# Patient Record
Sex: Male | Born: 1970
Health system: Southern US, Community
[De-identification: ages and names within clinical notes are randomized; demographics above are authoritative.]

## PROBLEM LIST (undated history)

## (undated) DIAGNOSIS — R519 Headache, unspecified: Secondary | ICD-10-CM

## (undated) DIAGNOSIS — G8929 Other chronic pain: Secondary | ICD-10-CM

## (undated) DIAGNOSIS — R011 Cardiac murmur, unspecified: Secondary | ICD-10-CM

## (undated) DIAGNOSIS — K219 Gastro-esophageal reflux disease without esophagitis: Secondary | ICD-10-CM

## (undated) DIAGNOSIS — I10 Essential (primary) hypertension: Secondary | ICD-10-CM

## (undated) DIAGNOSIS — R51 Headache: Secondary | ICD-10-CM

## (undated) HISTORY — DX: Essential (primary) hypertension: I10

## (undated) HISTORY — DX: Gastro-esophageal reflux disease without esophagitis: K21.9

## (undated) HISTORY — DX: Other chronic pain: G89.29

## (undated) HISTORY — DX: Headache, unspecified: R51.9

## (undated) HISTORY — DX: Headache: R51

## (undated) HISTORY — DX: Cardiac murmur, unspecified: R01.1

---

## 2005-06-30 HISTORY — PX: OTHER SURGICAL HISTORY: SHX169

## 2007-02-04 ENCOUNTER — Ambulatory Visit (HOSPITAL_BASED_OUTPATIENT_CLINIC_OR_DEPARTMENT_OTHER): Admission: RE | Admit: 2007-02-04 | Discharge: 2007-02-04 | Payer: Self-pay | Admitting: Orthopedic Surgery

## 2009-03-14 ENCOUNTER — Emergency Department (HOSPITAL_COMMUNITY): Admission: EM | Admit: 2009-03-14 | Discharge: 2009-03-14 | Payer: Self-pay | Admitting: Emergency Medicine

## 2009-06-01 ENCOUNTER — Encounter: Admission: RE | Admit: 2009-06-01 | Discharge: 2009-06-01 | Payer: Self-pay | Admitting: Sports Medicine

## 2009-06-28 ENCOUNTER — Encounter: Admission: RE | Admit: 2009-06-28 | Discharge: 2009-06-28 | Payer: Self-pay | Admitting: Sports Medicine

## 2009-06-30 HISTORY — PX: MICRODISCECTOMY LUMBAR: SUR864

## 2010-10-04 LAB — DIFFERENTIAL
Lymphocytes Relative: 16 % (ref 12–46)
Lymphs Abs: 1.4 10*3/uL (ref 0.7–4.0)
Monocytes Absolute: 0.5 10*3/uL (ref 0.1–1.0)
Monocytes Relative: 6 % (ref 3–12)
Neutro Abs: 6.8 10*3/uL (ref 1.7–7.7)

## 2010-10-04 LAB — CSF CELL COUNT WITH DIFFERENTIAL
RBC Count, CSF: 0 /mm3
RBC Count, CSF: 6 /mm3 — ABNORMAL HIGH
Tube #: 1
Tube #: 4

## 2010-10-04 LAB — BASIC METABOLIC PANEL
Chloride: 102 mEq/L (ref 96–112)
GFR calc non Af Amer: >60 mL/min (ref >60)
Glucose, Bld: 96 mg/dL (ref 70–99)
Potassium: 4 mEq/L (ref 3.5–5.1)
Sodium: 138 mEq/L (ref 135–145)

## 2010-10-04 LAB — CBC
Hemoglobin: 14.6 g/dL (ref 13.0–17.0)
RBC: 5.16 MIL/uL (ref 4.22–5.81)

## 2010-10-04 LAB — GRAM STAIN

## 2010-11-12 NOTE — Op Note (Signed)
Ray Herrera, Ray Herrera NO.:  [id number]   MEDICAL RECORD NO.:  [id number]          PATIENT TYPE:  AMB   LOCATION:  DSC                          FACILITY:  [facility]   PHYSICIAN:  Loreta Ave, M.D. DATE OF BIRTH:  07-07-1970   DATE OF PROCEDURE:  02/04/2007  DATE OF DISCHARGE:                               OPERATIVE REPORT   PREOPERATIVE DIAGNOSIS:  Right shoulder impingement distal clavicle  osteolysis.   POSTOPERATIVE DIAGNOSIS:  Right shoulder impingement distal clavicle  osteolysis with partial tearing supraspinatus tendon above and below.  Also complex anterior labral tear.   PROCEDURE:  Right shoulder exam under anesthesia, arthroscopy,  debridement of labrum and rotator cuff.  Acromioplasty CA ligament  release.  Excision distal clavicle.   SURGEON:  Loreta Ave, MD.   ASSISTANT:  Zonia Kief, PA.   ANESTHESIA:  General.   BLOOD LOSS:  Minimal.   SPECIMENS:  None.   CULTURES:  None.   COMPLICATIONS:  None.   DRESSING:  Soft compressive sling.   PROCEDURE:  The patient brought to the operating room, placed on  operating table in supine position.  After adequate anesthesia had been  obtained, right shoulder examined.  Full motion stable shoulder.  Placed  in a beach-chair position on a shoulder positioner, prepped and draped  in usual sterile fashion.  Three portals, anterior, posterior and  lateral.  Shoulder entered with blunt obturator, distended.  Arthroscope  introduced, shoulder inspected.  Complex anterior labral tearing  debrided.  Biceps tendon and biceps anchor intact.  The entire biceps  tendon was invested up into the bottom of the supraspinatus tendon but  was still intact.  Articular cartilage looked good.  After debriding the  labrum from the 12 to 3 o'clock position where it was torn, the  remaining shoulder inspected.  Partial tearing undersurface rotator cuff  debrided.  No full-thickness tears.  Capsule ligamentous  structures  intact.  Cannula redirected subacromially.  Type III acromion.  Obvious  impingement from there as well as from the distal clavicle.  Cuff  debrided.  Bursa resected.  Acromioplasty to a type I acromion releasing  the CA ligament with cautery.  Distal clavicle exposed.  Periarticular  spurs and a lateral centimeter of clavicle resected.  Adequacy of  decompression  clavicle incision confirmed viewing from all portals.  Instruments and  fluid removed.  Portals closed with nylon.  Sterile compressive dressing  applied.  Sling applied.  Anesthesia reversed.  Brought to the recovery  room.  Tolerated surgery well.  No complications.      Loreta Ave, M.D.  Electronically Signed     DFM/MEDQ  D:  02/04/2007  T:  02/04/2007  Job:  [id number]

## 2011-04-14 IMAGING — RF DG FLUORO GUIDE NDL PLC/BX
1 series · 1 of 1 positions shown · non-contrast
Comparison: None

CLINICAL DATA: Headache

FLUORO GUIDED NEEDLE PLACEMENT

[Series 1: run · 1 of 1 slices shown]
[im 1/1]
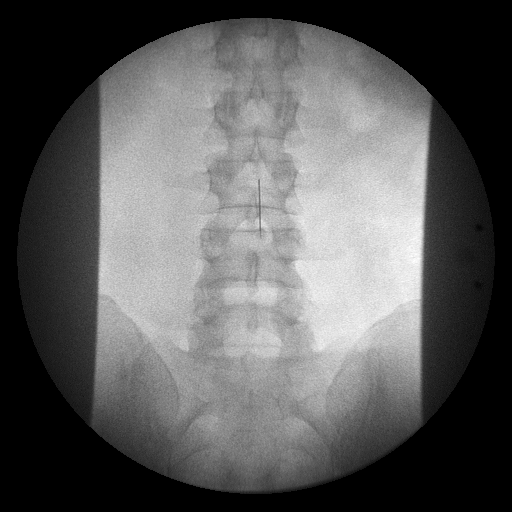

[1 of 1 positions shown; findings below may reference images not displayed]

FINDINGS: After preparation of the skin and local anesthesia, a 20
gauge spinal needle was advanced into the subarachnoid space at L2-
3, using a midline approach with fluoroscopic guidance.

Opening pressure is estimated at 26 cm water.  A total of
approximately 7 ml of clear CSF was collected into four vials and
sent for the laboratory studies requested.

Fluoro time 0.4 minutes.  The procedure well tolerated with no
immediate complications.
IMPRESSION: Fluoroscopic guided lumbar puncture yielding clear CSF with normal
opening pressure.

## 2011-08-06 ENCOUNTER — Encounter: Payer: Self-pay | Admitting: Physician Assistant

## 2011-08-06 ENCOUNTER — Telehealth: Payer: Self-pay | Admitting: Internal Medicine

## 2011-08-06 ENCOUNTER — Ambulatory Visit (INDEPENDENT_AMBULATORY_CARE_PROVIDER_SITE_OTHER): Payer: BC Managed Care – PPO | Admitting: Physician Assistant

## 2011-08-06 DIAGNOSIS — Z9889 Other specified postprocedural states: Secondary | ICD-10-CM

## 2011-08-06 DIAGNOSIS — K625 Hemorrhage of anus and rectum: Secondary | ICD-10-CM

## 2011-08-06 DIAGNOSIS — K219 Gastro-esophageal reflux disease without esophagitis: Secondary | ICD-10-CM

## 2011-08-06 MED ORDER — HYDROCORTISONE ACETATE 25 MG RE SUPP: RECTAL | Status: DC

## 2011-08-06 NOTE — Telephone Encounter (Signed)
Spoke with Jae Dire at Dr. Sherene Sires  office and patient told her he has had blood on tissue paper when he wipes x several months. For the last 2 days, when he sits on the toilet he has "free bleeding". Patient scheduled to see Mike Gip, PA today at 3:30 PM.

## 2011-08-06 NOTE — Patient Instructions (Addendum)
We sent a prescription for suppositories to [facility].  We have also given you a work note for today through Friday. We have given you a Hemorrhoid brochure. You can make an appointment with Dr. Arlyce Dice or Mike Gip PA for 2 weeks out.

## 2011-08-06 NOTE — Progress Notes (Signed)
Subjective:    Patient ID: Ray Herrera, male    DOB: 1971-04-16, 41 y.o.   MRN: [id number]  HPI Ray Herrera is a pleasant generally healthy 41 year old white male new to GI today referred by Dr. Rosanne Ashing Herrera/orthopedics. Patient has history of  right rotator cuff repair and a prior lumbar laminectomy. He presents stating that he had noticed a small amount of bright red blood on the tissue with bowel movements over the past couple of months which did not alarm him as he was not having any other symptoms. However over the past couple of days he has been passing somewhat larger amounts of blood with blood dripping into the commode with bowel movements. Again this is bright red blood and does stop after a bowel movement. He has not seen any clots nor any melena. He has no complaint of abdominal pain says he has minimal rectal soreness. He has not had any abdominal cramping or problems with changes in bowel habits or constipation.  He has not had any previous GI evaluation. He also does have history of GERD and has a prescription for Nexium which he says he takes 3 or 4 times a month. He has had more persistent problems with reflux in the past but that has generally been associated with taking anti-inflammatories on a regular basis. He denies any dysphagia or odynophagia and his appetite has been fine. He has a long history of weight training over the past 20 years but has not been doing as much recently because he did injure his back again in January. Family history is negative for colon cancer polyps or inflammatory bowel disease.    Review of Systems  Constitutional: Negative.   HENT: Negative.   Eyes: Negative.   Respiratory: Negative.   Cardiovascular: Negative.   Gastrointestinal: Positive for anal bleeding.  Genitourinary: Negative.   Musculoskeletal: Negative.   Skin: Negative.   Neurological: Negative.   Hematological: Negative.   Psychiatric/Behavioral: Negative.    Outpatient Encounter  Prescriptions as of 08/06/2011  Medication Sig Dispense Refill  . Esomeprazole Magnesium (NEXIUM PO) Take by mouth as needed.      . hydrocortisone (ANUSOL-HC) 25 MG suppository Use suppository rectally twice daily for 5 days, AM and PM  and at bedtime for 10 days.  20 suppository  1      Allergies  Allergen Reactions  . Other Itching    [place] Nuts   Active Ambulatory Problems    Diagnosis Date Noted  . GERD (gastroesophageal reflux disease) 08/06/2011  . Hx of decompressive lumbar laminectomy 08/06/2011  . H/O repair of rotator cuff 08/06/2011  . Rectal bleeding 08/06/2011   Resolved Ambulatory Problems    Diagnosis Date Noted  . No Resolved Ambulatory Problems   No Additional Past Medical History    Objective:   Physical Exam well-developed white male in no acute distress, pleasant blood pressure 120/80 pulse 74 weight 224 height 6 foot 1. HEENT; not hematocrit normal cephalic EOMI PERRLA sclera anicteric, Next abnormal no JVD, Cardiovascular; regular rate and rhythm with S1-S2 no murmur gallop, Pulmonary; clear bilaterally, Abdomen;; nontender nondistended no palpable masses or hepatosplenomegaly no guarding no rebound, bowel sounds are present, Rectal no external lesion noted on internal exam he has a swollen internal hemorrhoid which is non-thrombosed,, nontender no blood on the examining glove but mucous is heme positive. Extremities; no clubbing cyanosis or edema, Psych; mood and affect normal and appropriate        Assessment & Plan:  #1  41 year old white male with a several month history of very small volume bright red blood per rectum and now with 2 day history of increased hematochezia. On exam he has an internal hemorrhoid which is swollen but not thrombosed and his bleeding is very likely secondary to the internal hemorrhoid. He has not had any previous GI evaluation therefore cannot completely rule out other occult colon lesions. #2 History of GERD, intermittent PPI use  currently  Plan; start Anusol-HC suppositories twice daily for 5 days and then at bedtime for 10 more days. Patient to be out of work over the next 2-3 days, home to rest. 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 Patient advised to call should he have increasing bleeding over the next several days. Discussed colonoscopy and also upper endoscopy to screen for Barrett's given fairly chronic history of GERD. We will see him back in 2 weeks to reassess response to the suppositories and then decide regarding colonoscopy and upper endoscopy.

## 2011-08-07 NOTE — Progress Notes (Signed)
Reviewed and agree with management. Khaalid Lefkowitz D. Jennifer Payes, M.D., FACG  

## 2013-03-17 ENCOUNTER — Encounter: Payer: Self-pay | Admitting: Family Medicine

## 2013-03-17 ENCOUNTER — Ambulatory Visit (INDEPENDENT_AMBULATORY_CARE_PROVIDER_SITE_OTHER): Payer: BC Managed Care – PPO | Admitting: Family Medicine

## 2013-03-17 VITALS — BP 142/100 | HR 68 | Temp 98.1°F | Ht 71.5 in | Wt 228.0 lb

## 2013-03-17 DIAGNOSIS — K219 Gastro-esophageal reflux disease without esophagitis: Secondary | ICD-10-CM

## 2013-03-17 DIAGNOSIS — K625 Hemorrhage of anus and rectum: Secondary | ICD-10-CM

## 2013-03-17 DIAGNOSIS — Z136 Encounter for screening for cardiovascular disorders: Secondary | ICD-10-CM

## 2013-03-17 DIAGNOSIS — Z79899 Other long term (current) drug therapy: Secondary | ICD-10-CM

## 2013-03-17 DIAGNOSIS — R194 Change in bowel habit: Secondary | ICD-10-CM

## 2013-03-17 DIAGNOSIS — D239 Other benign neoplasm of skin, unspecified: Secondary | ICD-10-CM

## 2013-03-17 DIAGNOSIS — I10 Essential (primary) hypertension: Secondary | ICD-10-CM

## 2013-03-17 DIAGNOSIS — D229 Melanocytic nevi, unspecified: Secondary | ICD-10-CM

## 2013-03-17 DIAGNOSIS — Z125 Encounter for screening for malignant neoplasm of prostate: Secondary | ICD-10-CM

## 2013-03-17 DIAGNOSIS — K921 Melena: Secondary | ICD-10-CM

## 2013-03-17 DIAGNOSIS — R198 Other specified symptoms and signs involving the digestive system and abdomen: Secondary | ICD-10-CM

## 2013-03-17 DIAGNOSIS — Z833 Family history of diabetes mellitus: Secondary | ICD-10-CM

## 2013-03-17 DIAGNOSIS — Z0000 Encounter for general adult medical examination without abnormal findings: Secondary | ICD-10-CM

## 2013-03-17 DIAGNOSIS — R030 Elevated blood-pressure reading, without diagnosis of hypertension: Secondary | ICD-10-CM | POA: Insufficient documentation

## 2013-03-17 LAB — COMPREHENSIVE METABOLIC PANEL
ALT: 48 U/L (ref 0–53)
CO2: 27 mEq/L (ref 19–32)
Creatinine, Ser: 1.3 mg/dL (ref 0.4–1.5)
GFR: 66.31 mL/min (ref >60.00)
Total Bilirubin: 1.1 mg/dL (ref 0.3–1.2)

## 2013-03-17 LAB — CBC WITH DIFFERENTIAL/PLATELET
Basophils Absolute: 0 10*3/uL (ref 0.0–0.1)
Eosinophils Absolute: 0.1 10*3/uL (ref 0.0–0.7)
HCT: 51.7 % (ref 39.0–52.0)
Lymphs Abs: 2.3 10*3/uL (ref 0.7–4.0)
MCHC: 33.3 g/dL (ref 30.0–36.0)
MCV: 81.9 fl (ref 78.0–100.0)
Monocytes Absolute: 0.6 10*3/uL (ref 0.1–1.0)
Platelets: 239 10*3/uL (ref 150.0–400.0)
RDW: 16.7 % — ABNORMAL HIGH (ref 11.5–14.6)

## 2013-03-17 LAB — LIPID PANEL
Cholesterol: 187 mg/dL (ref 0–200)
HDL: 41.9 mg/dL (ref >39.00)
Triglycerides: 36 mg/dL (ref 0.0–149.0)
VLDL: 7.2 mg/dL (ref 0.0–40.0)

## 2013-03-17 LAB — PSA: PSA: 0.97 ng/mL (ref 0.10–4.00)

## 2013-03-17 LAB — HEMOGLOBIN A1C: Hgb A1c MFr Bld: 5.5 % (ref 4.6–6.5)

## 2013-03-17 NOTE — Progress Notes (Signed)
Subjective:    Patient ID: Ray Herrera, male    DOB: 1970/09/29, 42 y.o.   MRN: [id number]  HPI  42 yo male here to establish care and for CPX.  Changes in bowel habits- past several months, increased episodes of diarrhea with mucous.  Does also have blood in his stool 3-4 times per month.  Was seen by Dr. Juanda Chance last year for rectal bleeding and was diagnosed with probable internal hemorrhoid.  No colonoscopy or endoscopy done but it was mentioned as part of plan if symptoms continued. Does have family h/o IBD- old brother with Crohn's disease.  No family h/o colon CA. Also has associated GERD symptoms- he takes nexium (not daily).  Also drinks ETOH on weekends which worsens these symptoms. No weight loss.  Does endorse night sweats but he attributes this to anabolic steroid use. Denies gynecomastia or changes in testicular size or sexual function.  No family h/o prostate CA.  Elevated BP- BP elevated today.  Does not check outside of doctor's offices.  Does have FH of stroke and HTN. He does endorse occasional HA.  Patient Active Problem List   Diagnosis Date Noted  . Routine general medical examination at a health care facility 03/17/2013  . Screening for ischemic heart disease 03/17/2013  . H/O long-term treatment with high-risk medication 03/17/2013  . Elevated blood-pressure reading without diagnosis of hypertension   . GERD (gastroesophageal reflux disease) 08/06/2011  . Hx of decompressive lumbar laminectomy 08/06/2011  . H/O repair of rotator cuff 08/06/2011  . Rectal bleeding 08/06/2011   Past Medical History  Diagnosis Date  . GERD (gastroesophageal reflux disease)   . Hypertension    Past Surgical History  Procedure Laterality Date  . Shoulder surgery      right  . Lumbar disc surgery  2011   History  Substance Use Topics  . Smoking status: Never Smoker   . Smokeless tobacco: Never Used  . Alcohol Use: Yes     Comment: 2 shots weekly   Family History   Problem Relation Age of Onset  . Colon cancer Neg Hx   . Diabetes Father   . Heart disease Father   . Hypertension Father   . Diabetes Paternal Grandfather   . Heart disease Paternal Grandfather   . Lung cancer Maternal Grandfather   . Cancer      abdominal, maternal great aunt  . Crohn's disease Brother    Allergies  Allergen Reactions  . Other Itching    [place] Nuts   Current Outpatient Prescriptions on File Prior to Visit  Medication Sig Dispense Refill  . Esomeprazole Magnesium (NEXIUM PO) Take by mouth as needed.       No current facility-administered medications on file prior to visit.   The PMH, PSH, Social History, Family History, Medications, and allergies have been reviewed in [facility], and have been updated if relevant.   Review of Systems See HPI Patient reports no  vision/ hearing changes,anorexia, weight change, fever ,adenopathy, persistant / recurrent hoarseness, swallowing issues, chest pain, edema,persistant / recurrent cough, hemoptysis, dyspnea(rest, exertional, paroxysmal nocturnal), GU symptoms(dysuria, hematuria, pyuria, voiding/incontinence  Issues) syncope, focal weakness, severe memory loss, c depression, anxiety, abnormal bruising/bleeding, major joint swelling.       Objective:   Physical Exam BP 142/100  Pulse 68  Temp(Src) 98.1 F (36.7 C)  Ht 5' 11.5" (1.816 m)  Wt 228 lb (103.42 kg)  BMI 31.36 kg/m2 General:  Athletic appearing male in NAD Eyes:  PERRL  Ears:  External ear exam shows no significant lesions or deformities.  Otoscopic examination reveals clear canals, tympanic membranes are intact bilaterally without bulging, retraction, inflammation or discharge. Hearing is grossly normal bilaterally. Nose:  External nasal examination shows no deformity or inflammation. Nasal mucosa are pink and moist without lesions or exudates. Mouth:  Oral mucosa and oropharynx without lesions or exudates.  Teeth in good repair. Neck:  no carotid bruit or  thyromegaly no cervical or supraclavicular lymphadenopathy  Chest:  No gynecomastia Lungs:  Normal respiratory effort, chest expands symmetrically. Lungs are clear to auscultation, no crackles or wheezes. Heart:  Normal rate and regular rhythm. S1 and S2 normal without gallop, murmur, click, rub or other extra sounds. Pulses:  R and L posterior tibial pulses are full and equal bilaterally  Extremities:  no edema  Skin:  Several atypical nevi on back- pt unsure if they have changed.     Assessment & Plan:  1. Bowel habit changes Now with blood and mucous in stool. Discussed with pt- I do feel he needs to proceed with colonoscopy to rule out IBD, diverticulosis, hemorrhoids, malignancy, etc. The patient indicates understanding of these issues and agrees with the plan. Will refer back to GI. - Ambulatory referral to Gastroenterology  2. Bloody stool Deteriorated. See #1. - Ambulatory referral to Gastroenterology  3. Routine general medical examination at a health care facility Reviewed preventive care protocols, scheduled due services, and updated immunizations Discussed nutrition, exercise, diet, and healthy lifestyle.  - Comprehensive metabolic panel  4. Screening for ischemic heart disease  - Lipid Panel  5. GERD (gastroesophageal reflux disease) Persistent.  Continue Nexium.  Discussed that ETOH will worsen this.  Pt aware. He would likely benefit from endoscopy for further evaluation and to rule out Barrett's.   6. Elevated blood pressure with diagnosis of hypertension Normotensive at GI appt last year and was nervous about meeting new doctor. Asymptomatic.  Will have him come back to see me in 1 month to recheck BP. No rx today.   7. Screening for prostate cancer Will check PSA given anabolic steroid use. - PSA  8. Family history of diabetes mellitus (DM) * - Hemoglobin A1c  10. H/O long-term treatment with high-risk medication See #7. - CBC with  Differential - Testosterone, free, total  11. Atypical nevi Refer to derm. - Ambulatory referral to Dermatology

## 2013-03-17 NOTE — Patient Instructions (Addendum)
Good to see you. We will call you with your lab results and colonoscopy appointment.  Let's keep an eye on your blood pressure.  Please come back to see me next month so we can recheck this.

## 2013-03-18 ENCOUNTER — Encounter: Payer: Self-pay | Admitting: Gastroenterology

## 2013-03-18 LAB — TESTOSTERONE, FREE, TOTAL, SHBG: Testosterone: 1005 ng/dL — ABNORMAL HIGH (ref 300–890)

## 2013-03-22 ENCOUNTER — Ambulatory Visit: Payer: BC Managed Care – PPO | Admitting: Family Medicine

## 2013-04-01 ENCOUNTER — Ambulatory Visit: Payer: BC Managed Care – PPO | Admitting: Family Medicine

## 2013-04-04 ENCOUNTER — Encounter: Payer: Self-pay | Admitting: Family Medicine

## 2013-04-04 ENCOUNTER — Ambulatory Visit (INDEPENDENT_AMBULATORY_CARE_PROVIDER_SITE_OTHER): Payer: BC Managed Care – PPO | Admitting: Family Medicine

## 2013-04-04 VITALS — BP 130/94 | HR 76 | Temp 98.4°F | Wt 236.0 lb

## 2013-04-04 DIAGNOSIS — L0291 Cutaneous abscess, unspecified: Secondary | ICD-10-CM

## 2013-04-04 DIAGNOSIS — L0390 Cellulitis, unspecified: Secondary | ICD-10-CM

## 2013-04-04 MED ORDER — DOXYCYCLINE HYCLATE 100 MG PO TABS: 100.0000 mg | ORAL_TABLET | Freq: Two times a day (BID) | ORAL | Status: DC

## 2013-04-04 NOTE — Progress Notes (Signed)
Thought he had an insect bite, he didn't see the insect.  [place] skin changes.  First noted about 4 days ago. No FCNAVD.  Areas is tender and hot.    Meds, vitals, and allergies reviewed.   ROS: See HPI.  Otherwise, noncontributory.  Nad L foot and ankle with normal inspection except for 8cm red warm area on distal medial shin w/o fluctuant mass

## 2013-04-04 NOTE — Patient Instructions (Addendum)
Start the doxycycline today.  Careful about sun exposure.    Elevate your leg as much as possible.  If not improving then let us know.  Take care.

## 2013-04-05 DIAGNOSIS — L0390 Cellulitis, unspecified: Secondary | ICD-10-CM | POA: Insufficient documentation

## 2013-04-05 NOTE — Assessment & Plan Note (Signed)
Okay for outpatient f/u.  Start doxy for cellulitis with routine cautions re: med and f/u.  He agrees.  Notify PCP as Lorain Childes.

## 2013-04-11 ENCOUNTER — Telehealth: Payer: Self-pay

## 2013-04-11 MED ORDER — DOXYCYCLINE HYCLATE 100 MG PO TABS: 100.0000 mg | ORAL_TABLET | Freq: Two times a day (BID) | ORAL | Status: DC

## 2013-04-11 NOTE — Telephone Encounter (Signed)
rx sent to pharmacy by e-script  

## 2013-04-11 NOTE — Telephone Encounter (Signed)
pts wife left v/m; pt was out of town this past weekend and lost doxycycline given to pt on 04/04/13; pt needs # 10 sent to [facility].Please advise.

## 2013-04-11 NOTE — Telephone Encounter (Signed)
Please call in #10 pills.  Thanks.  Same sig as prev.

## 2013-04-18 ENCOUNTER — Ambulatory Visit: Payer: BC Managed Care – PPO | Admitting: Family Medicine

## 2013-04-19 ENCOUNTER — Encounter: Payer: Self-pay | Admitting: Gastroenterology

## 2013-04-19 ENCOUNTER — Ambulatory Visit (INDEPENDENT_AMBULATORY_CARE_PROVIDER_SITE_OTHER): Payer: BC Managed Care – PPO | Admitting: Gastroenterology

## 2013-04-19 VITALS — BP 136/90 | HR 76 | Ht 71.65 in | Wt 234.0 lb

## 2013-04-19 DIAGNOSIS — K625 Hemorrhage of anus and rectum: Secondary | ICD-10-CM

## 2013-04-19 MED ORDER — NA SULFATE-K SULFATE-MG SULF 17.5-3.13-1.6 GM/177ML PO SOLN: 1.0000 | Freq: Once | ORAL | Status: DC

## 2013-04-19 NOTE — Patient Instructions (Signed)

## 2013-04-19 NOTE — Progress Notes (Signed)
History of Present Illness:  The patient has returned for reevaluation of rectal bleeding.  With almost every bowel movement he has bright red blood present consisting of blood on the tissue and mixed with his stools and in the water.  He denies rectal pain.  Over the past year stools have become somewhat erratic.  At times they're formed but at other times less well formed.  He denies abdominal pain.  He continues to have pyrosis but this is very intermittent.  He takes PPI therapy intermittently as well.  Family history is pertinent for Crohn's disease in his brother.    Review of Systems: Pertinent positive and negative review of systems were noted in the above HPI section. All other review of systems were otherwise negative.    Current Medications, Allergies, Past Medical History, Past Surgical History, Family History and Social History were reviewed in [employer] electronic medical record  Vital signs were reviewed in today's medical record. Physical Exam: General: Well developed , well nourished, no acute distress Skin: anicteric Head: Normocephalic and atraumatic Eyes:  sclerae anicteric, EOMI Ears: Normal auditory acuity Mouth: No deformity or lesions Lungs: Clear throughout to auscultation Heart: Regular rate and rhythm; no murmurs, rubs or bruits Abdomen: Soft, non tender and non distended. No masses, hepatosplenomegaly or hernias noted. Normal Bowel sounds Rectal: There are small external skin tags Musculoskeletal: Symmetrical with no gross deformities  Pulses:  Normal pulses noted Extremities: No clubbing, cyanosis, edema or deformities noted Neurological: Alert oriented x 4, grossly nonfocal Psychological:  Alert and cooperative. Normal mood and affect

## 2013-04-19 NOTE — Assessment & Plan Note (Signed)
Long history of intermittent rectal bleeding which is becoming more constant.  I suspect this is hemorrhoidal bleeding although a more proximal colonic bleeding source should be ruled out.  Recommendations #1 colonoscopy #2 it is determined that he is bleeding from hemorrhoids I will schedule him for band ligation

## 2013-04-19 NOTE — Assessment & Plan Note (Signed)
This is an intermittent problem for which he takes when necessary PPI therapy.  He will continue with the same

## 2013-04-20 ENCOUNTER — Encounter: Payer: Self-pay | Admitting: Gastroenterology

## 2013-05-20 ENCOUNTER — Encounter: Payer: Self-pay | Admitting: Gastroenterology

## 2013-05-20 ENCOUNTER — Ambulatory Visit (AMBULATORY_SURGERY_CENTER): Payer: BC Managed Care – PPO | Admitting: Gastroenterology

## 2013-05-20 VITALS — BP 132/82 | HR 63 | Temp 98.5°F | Resp 18 | Ht 71.0 in | Wt 234.0 lb

## 2013-05-20 DIAGNOSIS — D126 Benign neoplasm of colon, unspecified: Secondary | ICD-10-CM

## 2013-05-20 DIAGNOSIS — K625 Hemorrhage of anus and rectum: Secondary | ICD-10-CM

## 2013-05-20 MED ORDER — SODIUM CHLORIDE 0.9 % IV SOLN: 500.0000 mL | INTRAVENOUS | Status: DC

## 2013-05-20 NOTE — Op Note (Addendum)
[facility] [address] [place], [address]   COLONOSCOPY PROCEDURE REPORT  PATIENT: Ray Herrera, Ray Herrera  MR#: [id number] BIRTHDATE: [birth year] , 42  yrs. old GENDER: Male ENDOSCOPIST: Louis Meckel, MD REFERRED XB:JYNWG Aron, M.D. PROCEDURE DATE:  05/20/2013 PROCEDURE:   Colonoscopy with cold biopsy polypectomy First Screening Colonoscopy - Avg.  risk and is 50 yrs.  old or older Yes.  Prior Negative Screening - Now for repeat screening. N/A  History of Adenoma - Now for follow-up colonoscopy & has been > or = to 3 yrs.  N/A  Polyps Removed Today? Yes. ASA CLASS:   Class II INDICATIONS:Rectal Bleeding. MEDICATIONS: MAC sedation, administered by CRNA and propofol (Diprivan) 350mg  IV  DESCRIPTION OF PROCEDURE:   After the risks benefits and alternatives of the procedure were thoroughly explained, informed consent was obtained.  A digital rectal exam revealed no abnormalities of the rectum.   The LB [id number]  endoscope was introduced through the anus and advanced to the cecum, which was identified by both the appendix and ileocecal valve. No adverse events experienced.   The quality of the prep was excellent using Suprep  The instrument was then slowly withdrawn as the colon was fully examined.      COLON FINDINGS: A sessile 2mm polyp was found in the ascending colon.  A polypectomy was performed with cold forceps.   Internal hemorrhoids were found.   The colon was otherwise normal.  There was no diverticulosis, inflammation, polyps or cancers unless previously stated.  Retroflexed views revealed no abnormalities. The time to cecum=1 minutes 52 seconds.  Withdrawal time=12 minutes 09 seconds.  The scope was withdrawn and the procedure completed. COMPLICATIONS: There were no complications.  ENDOSCOPIC IMPRESSION: 1.   Sessile polyp was found in the ascending colon; polypectomy was performed with cold forceps 2.   Internal hemorrhoids 3.   The  colon was otherwise normal  RECOMMENDATIONS: band ligation of hemorrhoids for rectal bleeding  eSigned:  Louis Meckel, MD 05/25/2013 9:00 AM Revised: 05/25/2013 9:00 AM  cc:   PATIENT NAME:  Ray Herrera, Ray Herrera MR#: [id number]

## 2013-05-20 NOTE — Progress Notes (Signed)
Called to room to assist during endoscopic procedure.  Patient ID and intended procedure confirmed with present staff. Received instructions for my participation in the procedure from the performing physician. ewm 

## 2013-05-20 NOTE — Patient Instructions (Signed)
Polyp information sheet given.  Hemorrhoid information sheet and pamplet given.  YOU HAD AN ENDOSCOPIC PROCEDURE TODAY AT THE [facility]: Refer to the procedure report that was given to you for any specific questions about what was found during the examination.  If the procedure report does not answer your questions, please call your gastroenterologist to clarify.  If you requested that your care partner not be given the details of your procedure findings, then the procedure report has been included in a sealed envelope for you to review at your convenience later.  YOU SHOULD EXPECT: Some feelings of bloating in the abdomen. Passage of more gas than usual.  Walking can help get rid of the air that was put into your GI tract during the procedure and reduce the bloating. If you had a lower endoscopy (such as a colonoscopy or flexible sigmoidoscopy) you may notice spotting of blood in your stool or on the toilet paper. If you underwent a bowel prep for your procedure, then you may not have a normal bowel movement for a few days.  DIET: Your first meal following the procedure should be a light meal and then it is ok to progress to your normal diet.  A half-sandwich or bowl of soup is an example of a good first meal.  Heavy or fried foods are harder to digest and may make you feel nauseous or bloated.  Likewise meals heavy in dairy and vegetables can cause extra gas to form and this can also increase the bloating.  Drink plenty of fluids but you should avoid alcoholic beverages for 24 hours.  ACTIVITY: Your care partner should take you home directly after the procedure.  You should plan to take it easy, moving slowly for the rest of the day.  You can resume normal activity the day after the procedure however you should NOT DRIVE or use heavy machinery for 24 hours (because of the sedation medicines used during the test).    SYMPTOMS TO REPORT IMMEDIATELY: A gastroenterologist can be reached at  any hour.  During normal business hours, 8:30 AM to 5:00 PM Monday through Friday, call [phone].  After hours and on weekends, please call the GI answering service at [phone] who will take a message and have the physician on call contact you.   Following lower endoscopy (colonoscopy or flexible sigmoidoscopy):  Excessive amounts of blood in the stool  Significant tenderness or worsening of abdominal pains  Swelling of the abdomen that is new, acute  Fever of 100F or higher  FOLLOW UP: If any biopsies were taken you will be contacted by phone or by letter within the next 1-3 weeks.  Call your gastroenterologist if you have not heard about the biopsies in 3 weeks.  Our staff will call the home number listed on your records the next business day following your procedure to check on you and address any questions or concerns that you may have at that time regarding the information given to you following your procedure. This is a courtesy call and so if there is no answer at the home number and we have not heard from you through the emergency physician on call, we will assume that you have returned to your regular daily activities without incident.  SIGNATURES/CONFIDENTIALITY: You and/or your care partner have signed paperwork which will be entered into your electronic medical record.  These signatures attest to the fact that that the information above on your After Visit Summary has been  reviewed and is understood.  Full responsibility of the confidentiality of this discharge information lies with you and/or your care-partner. 

## 2013-05-20 NOTE — Progress Notes (Signed)
Patient did not have preoperative order for IV antibiotic SSI prophylaxis. (G8918)  Patient did not experience any of the following events: a burn prior to discharge; a fall within the facility; wrong site/side/patient/procedure/implant event; or a hospital transfer or hospital admission upon discharge from the facility. (G8907)  

## 2013-05-20 NOTE — Progress Notes (Signed)
  [facility] Anesthesia Post-op Note  Patient: Ray Herrera  Procedure(s) Performed: colonoscopy  Patient Location: [facility] - Recovery Area  Anesthesia Type: Deep Sedation/Propofol  Level of Consciousness: awake, oriented and patient cooperative  Airway and Oxygen Therapy: Patient Spontanous Breathing  Post-op Pain: none  Post-op Assessment:  Post-op Vital signs reviewed, Patient's Cardiovascular Status Stable, Respiratory Function Stable, Patent Airway, No signs of Nausea or vomiting and Pain level controlled  Post-op Vital Signs: Reviewed and stable  Complications: No apparent anesthesia complications  Tempest Frankland E 3:59 PM

## 2013-05-23 ENCOUNTER — Telehealth: Payer: Self-pay | Admitting: *Deleted

## 2013-05-23 NOTE — Telephone Encounter (Signed)
Spoke with pt.  States  He is doing well, no pain, fever or problems.  Will get app. For hemorrhoid banding.  Advised that he will hear from his pathology in one Week to 10 days.

## 2013-05-25 ENCOUNTER — Encounter: Payer: Self-pay | Admitting: Gastroenterology

## 2013-06-01 ENCOUNTER — Telehealth: Payer: Self-pay | Admitting: *Deleted

## 2013-06-01 NOTE — Telephone Encounter (Signed)
Message copied by Marlowe Kays on Wed Jun 01, 2013  2:05 PM ------      Message from: Melvia Heaps D      Created: Fri May 20, 2013  3:58 PM       Please schedule for hemorrhoidal band ligation ------

## 2013-06-01 NOTE — Telephone Encounter (Signed)
Mailed letter with appt date and time on it

## 2013-06-03 ENCOUNTER — Other Ambulatory Visit: Payer: Self-pay | Admitting: Physician Assistant

## 2013-07-11 ENCOUNTER — Ambulatory Visit (INDEPENDENT_AMBULATORY_CARE_PROVIDER_SITE_OTHER): Payer: BC Managed Care – PPO | Admitting: Gastroenterology

## 2013-07-11 ENCOUNTER — Encounter: Payer: Self-pay | Admitting: Gastroenterology

## 2013-07-11 VITALS — BP 118/86 | HR 72 | Ht 71.0 in | Wt 234.0 lb

## 2013-07-11 DIAGNOSIS — K648 Other hemorrhoids: Secondary | ICD-10-CM

## 2013-07-11 NOTE — Progress Notes (Signed)
PROCEDURE NOTE: The patient presents with symptomatic grade *2**  hemorrhoids, requesting rubber band ligation of his/her hemorrhoidal disease.  All risks, benefits and alternative forms of therapy were described and informed consent was obtained.   The anorectum was pre-medicated with lubricant and nitroglycerine ointment The decision was made to band the *left lateral** internal hemorrhoid, and the [facility] O'Regan System was used to perform band ligation without complication.  Digital anorectal examination was then performed to assure proper positioning of the band, and to adjust the banded tissue as required.  The patient was discharged home without pain or other issues.  Dietary and behavioral recommendations were given and along with follow-up instructions.    The patient will return in *2** for  follow-up and possible additional banding as required. No complications were encountered and the patient tolerated the procedure well.   

## 2013-07-11 NOTE — Patient Instructions (Signed)
Your 2nd banding is scheduled on 08/22/2013 at 1:45pm  [place]   1. The procedure you have had should have been relatively painless since the banding of the area involved does not have nerve endings and there is no pain sensation.  The rubber band cuts off the blood supply to the hemorrhoid and the band may fall off as soon as 48 hours after the banding (the band may occasionally be seen in the toilet bowl following a bowel movement). You may notice a temporary feeling of fullness in the rectum which should respond adequately to plain Tylenol or Motrin.  2. Following the banding, avoid strenuous exercise that evening and resume full activity the next day.  A sitz bath (soaking in a warm tub) or bidet is soothing, and can be useful for cleansing the area after bowel movements.     3. To avoid constipation, take two tablespoons of natural wheat bran, natural oat bran, flax, Benefiber or any over the counter fiber supplement and increase your water intake to 7-8 glasses daily.    4. Unless you have been prescribed anorectal medication, do not put anything inside your rectum for two weeks: No suppositories, enemas, fingers, etc.  5. Occasionally, you may have more bleeding than usual after the banding procedure.  This is often from the untreated hemorrhoids rather than the treated one.  Don't be concerned if there is a tablespoon or so of blood.  If there is more blood than this, lie flat with your bottom higher than your head and apply an ice pack to the area. If the bleeding does not stop within a half an hour or if you feel faint, call our office at [phone] or go to the emergency room.  6. Problems are not common; however, if there is a substantial amount of bleeding, severe pain, chills, fever or difficulty passing urine (very rare) or other problems, you should call us at [phone] or report to the nearest emergency room.  7. Do not stay  seated continuously for more than 2-3 hours for a day or two after the procedure.  Tighten your buttock muscles 10-15 times every two hours and take 10-15 deep breaths every 1-2 hours.  Do not spend more than a few minutes on the toilet if you cannot empty your bowel; instead re-visit the toilet at a later time.

## 2013-08-18 ENCOUNTER — Encounter: Payer: Self-pay | Admitting: Internal Medicine

## 2013-08-18 ENCOUNTER — Other Ambulatory Visit: Payer: Self-pay

## 2013-08-18 ENCOUNTER — Ambulatory Visit (INDEPENDENT_AMBULATORY_CARE_PROVIDER_SITE_OTHER): Payer: BC Managed Care – PPO | Admitting: Internal Medicine

## 2013-08-18 ENCOUNTER — Telehealth: Payer: Self-pay

## 2013-08-18 VITALS — BP 132/90 | HR 83 | Temp 98.0°F | Wt 244.0 lb

## 2013-08-18 DIAGNOSIS — M5430 Sciatica, unspecified side: Secondary | ICD-10-CM

## 2013-08-18 MED ORDER — METHYLPREDNISOLONE ACETATE 80 MG/ML IJ SUSP
80.0000 mg | Freq: Once | INTRAMUSCULAR | Status: AC
  Administered 2013-08-18: 80 mg via INTRAMUSCULAR

## 2013-08-18 MED ORDER — TRAMADOL HCL 50 MG PO TABS: 50.0000 mg | ORAL_TABLET | Freq: Three times a day (TID) | ORAL | Status: DC | PRN

## 2013-08-18 NOTE — Patient Instructions (Addendum)

## 2013-08-18 NOTE — Addendum Note (Signed)
Addended by: Lurlean Nanny on: 08/18/2013 04:50 PM   Modules accepted: Orders

## 2013-08-18 NOTE — Progress Notes (Signed)
Pre visit review using our clinic review tool, if applicable. No additional management support is needed unless otherwise documented below in the visit note. 
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
## 2013-08-18 NOTE — Addendum Note (Signed)
Addended by: Jearld Fenton on: 08/18/2013 01:49 PM   Modules accepted: Orders

## 2013-08-18 NOTE — Telephone Encounter (Signed)
Tramadol per verbal order from [facility]

## 2013-08-22 ENCOUNTER — Telehealth: Payer: Self-pay | Admitting: Gastroenterology

## 2013-08-22 ENCOUNTER — Encounter: Payer: BC Managed Care – PPO | Admitting: Gastroenterology

## 2013-08-22 NOTE — Telephone Encounter (Signed)
No charge. 

## 2014-06-14 ENCOUNTER — Ambulatory Visit (INDEPENDENT_AMBULATORY_CARE_PROVIDER_SITE_OTHER): Payer: BC Managed Care – PPO | Admitting: Internal Medicine

## 2014-06-14 ENCOUNTER — Encounter: Payer: Self-pay | Admitting: Internal Medicine

## 2014-06-14 VITALS — BP 122/80 | HR 70 | Temp 97.5°F | Wt 242.0 lb

## 2014-06-14 DIAGNOSIS — K648 Other hemorrhoids: Secondary | ICD-10-CM

## 2014-06-14 DIAGNOSIS — M79642 Pain in left hand: Secondary | ICD-10-CM

## 2014-06-14 NOTE — Assessment & Plan Note (Signed)
Not arthritic Not sure if this is from his neck--but may well be CTS Discussed trying brace at night Will set up with neuro for definitive diagnosis

## 2014-06-14 NOTE — Progress Notes (Signed)
Pre visit review using our clinic review tool, if applicable. No additional management support is needed unless otherwise documented below in the visit note. 

## 2014-06-14 NOTE — Progress Notes (Signed)
   Subjective:    Patient ID: Ray Herrera, male    DOB: 08/26/1970, 43 y.o.   MRN: [id number]  HPI Here for rectal bleeding Wants referral to GI  He had banding once but didn't go back for the other procedures Ongoing bleeding Mostly just blood with stool---slight bleeding after No regular external lesions  Also having pain in his left hand-- probably started around 6-8 months ago Trouble making a fist May have pain even straightening his hands Had to change grip on weight bar--keeping left hand under No pain in forearm Intermittent numbness in hands---not sure which fingers  Also with pain in neck and shoulder Limited ROM and pain in gym Did have surgery on right shoulder by Dr Percell Miller Does heavy lifting in the gym---has had to decrease the weight he pushes  Current Outpatient Prescriptions on File Prior to Visit  Medication Sig Dispense Refill  . Esomeprazole Magnesium (NEXIUM PO) Take by mouth as needed.    . traMADol (ULTRAM) 50 MG tablet Take 1 tablet (50 mg total) by mouth every 8 (eight) hours as needed. 30 tablet 0   No current facility-administered medications on file prior to visit.    Allergies  Allergen Reactions  . Other Itching    [place] Nuts    Past Medical History  Diagnosis Date  . GERD (gastroesophageal reflux disease)   . Hypertension   . Chronic headaches     Past Surgical History  Procedure Laterality Date  . Distoclavicalectomy      right  . Microdiscectomy lumbar  2011    L5-S1    Family History  Problem Relation Age of Onset  . Colon cancer Neg Hx   . Diabetes Father   . Heart disease Father   . Hypertension Father   . Diabetes Paternal Grandfather   . Heart disease Paternal Grandfather   . Lung cancer Maternal Grandfather   . Cancer      abdominal, maternal great aunt  . Crohn's disease Brother   . Irritable bowel syndrome Father     History   Social History  . Marital Status: Married    Spouse Name: N/A    Number of  Children: 1  . Years of Education: N/A   Occupational History  .  Surveyor, mining   Social History Main Topics  . Smoking status: Never Smoker   . Smokeless tobacco: Never Used  . Alcohol Use: Yes     Comment: 2 shots weekly  . Drug Use: No  . Sexual Activity: Not on file   Other Topics Concern  . Not on file   Social History Narrative   Caffeine  Daily   Married to [employer]   Does use anabolic steroids- lifts weight   Works as Radiation protection practitioner               Review of Systems Has dropped some weight from less weight training Has never slept well--- hand may be worst then    Objective:   Physical Exam  Neck: Normal range of motion. Neck supple.  Musculoskeletal:  No joint swelling or tenderness in left hand or elbow Limited abduction and external rotation of shoulder--but not bad  Neurological:  Slight decreased left hand grip strength Pain with attempts to passively extend fingers on left---?more in 3rd and 4th          Assessment & Plan:

## 2014-06-14 NOTE — Assessment & Plan Note (Signed)
Will try to set up repeat banding with Dr Deatra Ina

## 2014-07-21 ENCOUNTER — Ambulatory Visit: Payer: BC Managed Care – PPO | Admitting: Neurology

## 2014-07-28 ENCOUNTER — Encounter: Payer: Self-pay | Admitting: Neurology

## 2014-07-28 ENCOUNTER — Ambulatory Visit (INDEPENDENT_AMBULATORY_CARE_PROVIDER_SITE_OTHER): Payer: BLUE CROSS/BLUE SHIELD | Admitting: Neurology

## 2014-07-28 VITALS — BP 124/80 | HR 62 | Ht 73.0 in | Wt 245.3 lb

## 2014-07-28 DIAGNOSIS — M6289 Other specified disorders of muscle: Secondary | ICD-10-CM

## 2014-07-28 DIAGNOSIS — R202 Paresthesia of skin: Secondary | ICD-10-CM

## 2014-07-28 DIAGNOSIS — R29898 Other symptoms and signs involving the musculoskeletal system: Secondary | ICD-10-CM

## 2014-07-28 LAB — RHEUMATOID FACTOR

## 2014-07-28 LAB — C-REACTIVE PROTEIN: CRP: <0.5 mg/dL (ref <0.60)
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
## 2014-07-28 NOTE — Patient Instructions (Addendum)
1.  MRI cervical spine wo contrast 2.  Check ESR, CRP, ANA, rheumatoid factor 3.  Telephone update with results, consider EMG going forward 

## 2014-07-29 LAB — SEDIMENTATION RATE: Sed Rate: 1 mm/hr (ref 0–16)

## 2014-07-31 ENCOUNTER — Telehealth: Payer: Self-pay | Admitting: Neurology

## 2014-07-31 LAB — ANA: Anti Nuclear Antibody(ANA): NEGATIVE

## 2014-07-31 NOTE — Telephone Encounter (Signed)
Left message on machine to make patient aware of MR scheduled at [facility] on 08/10/2014 at 5:50 pm. He is to call with any questions.

## 2014-08-10 ENCOUNTER — Ambulatory Visit
Admission: RE | Admit: 2014-08-10 | Discharge: 2014-08-10 | Disposition: A | Discharge status: Home / Self Care | Payer: BLUE CROSS/BLUE SHIELD | Source: Ambulatory Visit | Attending: Neurology | Admitting: Neurology

## 2014-08-10 DIAGNOSIS — R202 Paresthesia of skin: Secondary | ICD-10-CM

## 2014-08-10 DIAGNOSIS — R29898 Other symptoms and signs involving the musculoskeletal system: Secondary | ICD-10-CM

## 2014-08-11 ENCOUNTER — Ambulatory Visit (INDEPENDENT_AMBULATORY_CARE_PROVIDER_SITE_OTHER): Payer: BLUE CROSS/BLUE SHIELD | Admitting: Gastroenterology

## 2014-08-11 ENCOUNTER — Encounter: Payer: Self-pay | Admitting: Gastroenterology

## 2014-08-11 VITALS — BP 132/82 | HR 72 | Ht 71.5 in | Wt 241.2 lb

## 2014-08-11 DIAGNOSIS — K648 Other hemorrhoids: Secondary | ICD-10-CM

## 2014-08-11 NOTE — Assessment & Plan Note (Signed)
Patient remains symptomatic status post hemorrhoidal banding 1.  He is agreeable to banding the other 2 hemorrhoidal bundles.

## 2014-08-11 NOTE — Patient Instructions (Signed)
Your appointment for your 2nd banding is scheduled on 09/29/2014 Friday at 10:45am

## 2014-08-11 NOTE — Progress Notes (Signed)
Addendum  MRI cervical spine 08/11/2014: Worsening of degenerative spondylosis at C5-6 and C6-7. Endplate osteophytes and protruding disc material more prominent left posterior lateral direction. No cord compression. Foraminal narrowing at these levels bilaterally, but worse on the left.  Chronic spondylosis at C4-5 with moderate foraminal narrowing bilaterally.  Chronic left foraminal narrowing at C2-3 because of encroachment by the facet joint.  The above results were discussed with patient.  His left hand weakness is most likely due to C7 radiculopathy.  I suggested that he start occupational therapy, but since he is already very active at baseline and has been exercising these muscle groups, would be reasonable to at least get the opinion for a spine surgeon. He has previously seen Dr. Sherley Bounds and would like to set up follow-up with him to discuss options, since there has been no improvement of hand weakness.  Donika K. Posey Pronto, DO

## 2014-08-11 NOTE — Progress Notes (Signed)
      History of Present Illness:  Ray Herrera has returned for reevaluation of rectal bleeding and discomfort.  This is a daily occurrence.  One year ago he underwent banding of the left lateral internal hemorrhoidal bundle.  He did not return for follow-up therapy.  He has tried various topicals without relief.    Review of Systems: Pertinent positive and negative review of systems were noted in the above HPI section. All other review of systems were otherwise negative.    Current Medications, Allergies, Past Medical History, Past Surgical History, Family History and Social History were reviewed in [facility] record  Vital signs were reviewed in today's medical record. Physical Exam: General: Well developed , well nourished, no acute distress On examination the rectum there are no external lesions   See Assessment and Plan under Problem List

## 2014-08-11 NOTE — Progress Notes (Signed)
Referral and notes faxed.

## 2014-09-28 ENCOUNTER — Ambulatory Visit: Payer: BLUE CROSS/BLUE SHIELD | Admitting: Family Medicine

## 2014-09-28 ENCOUNTER — Ambulatory Visit (INDEPENDENT_AMBULATORY_CARE_PROVIDER_SITE_OTHER): Payer: BLUE CROSS/BLUE SHIELD | Admitting: Family Medicine

## 2014-09-28 ENCOUNTER — Encounter: Payer: Self-pay | Admitting: Family Medicine

## 2014-09-28 VITALS — BP 128/82 | HR 81 | Temp 98.4°F | Wt 240.5 lb

## 2014-09-28 DIAGNOSIS — J0110 Acute frontal sinusitis, unspecified: Secondary | ICD-10-CM

## 2014-09-28 MED ORDER — FLUTICASONE PROPIONATE 50 MCG/ACT NA SUSP: 2.0000 | Freq: Every day | NASAL | Status: DC

## 2014-09-28 MED ORDER — HYDROCOD POLST-CHLORPHEN POLST 10-8 MG/5ML PO LQCR: 5.0000 mL | Freq: Every evening | ORAL | Status: DC | PRN

## 2014-09-28 MED ORDER — AMOXICILLIN 875 MG PO TABS
875.0000 mg | ORAL_TABLET | Freq: Two times a day (BID) | ORAL | Status: DC
Start: 2014-09-28 — End: 2015-08-02

## 2014-09-28 NOTE — Progress Notes (Signed)
SUBJECTIVE:  Ray Herrera is a 44 y.o. male who complains of coryza, congestion, sneezing, sore throat, swollen glands, right sinus pain and chills for 8 days. He denies a history of shortness of breath and vomiting and denies a history of asthma. Patient denies smoke cigarettes.   Current Outpatient Prescriptions on File Prior to Visit  Medication Sig Dispense Refill  . Esomeprazole Magnesium (NEXIUM PO) Take by mouth as needed.     No current facility-administered medications on file prior to visit.    Allergies  Allergen Reactions  . Other Itching    [place] Nuts    Past Medical History  Diagnosis Date  . GERD (gastroesophageal reflux disease)   . Hypertension   . Chronic headaches     Past Surgical History  Procedure Laterality Date  . Distoclavicalectomy      right  . Microdiscectomy lumbar  2011    L5-S1    Family History  Problem Relation Age of Onset  . Colon cancer Neg Hx   . Diabetes Father   . Heart disease Father   . Hypertension Father   . Diabetes Paternal Grandfather   . Heart disease Paternal Grandfather   . Lung cancer Maternal Grandfather   . Cancer      abdominal, maternal great aunt  . Crohn's disease Brother   . Irritable bowel syndrome Father     History   Social History  . Marital Status: Married    Spouse Name: N/A  . Number of Children: 1  . Years of Education: N/A   Occupational History  .  Surveyor, mining   Social History Main Topics  . Smoking status: Never Smoker   . Smokeless tobacco: Never Used  . Alcohol Use: 0.0 oz/week    0 Standard drinks or equivalent per week     Comment: 2 shots weekly  . Drug Use: No  . Sexual Activity: Not on file   Other Topics Concern  . Not on file   Social History Narrative   Caffeine  Daily   Married to [employer]   Does use anabolic steroids- lifts weight   Works as Radiation protection practitioner               The [place], [place], Social History, Family History, Medications, and allergies have  been reviewed in [facility], and have been updated if relevant.  OBJECTIVE: BP 128/82 mmHg  Pulse 81  Temp(Src) 98.4 F (36.9 C) (Oral)  Wt 240 lb 8 oz (109.09 kg)  SpO2 96%  He appears well, vital signs are as noted. Ears normal.  Throat and pharynx normal.  Neck supple. No adenopathy in the neck. Nose is congested. Sinuses tender. The chest is clear, without wheezes or rales.  ASSESSMENT:  sinusitis  PLAN: Given duration and progression of symptoms, will treat for bacterial sinusitis with amoxicillin. eRx sent for flonase, given rx for tussionex as needed at bedtime for cough. Symptomatic therapy suggested: push fluids, rest and return office visit prn if symptoms persist or worsen. Call or return to clinic prn if these symptoms worsen or fail to improve as anticipated.

## 2014-09-28 NOTE — Progress Notes (Signed)
Pre visit review using our clinic review tool, if applicable. No additional management support is needed unless otherwise documented below in the visit note. 

## 2014-09-28 NOTE — Patient Instructions (Signed)
Good to see you. Please take amoxicillin twice daily x 10 days. Start flonase and zyrtec.  Take Tussionex at bedtime as needed for cough.

## 2014-09-29 ENCOUNTER — Encounter: Payer: BLUE CROSS/BLUE SHIELD | Admitting: Gastroenterology

## 2014-11-02 ENCOUNTER — Encounter: Payer: Self-pay | Admitting: Family Medicine

## 2014-11-02 ENCOUNTER — Encounter: Payer: Self-pay | Admitting: *Deleted

## 2014-11-02 ENCOUNTER — Ambulatory Visit (INDEPENDENT_AMBULATORY_CARE_PROVIDER_SITE_OTHER): Payer: BLUE CROSS/BLUE SHIELD | Admitting: Family Medicine

## 2014-11-02 VITALS — BP 130/82 | HR 67 | Temp 98.0°F | Wt 244.5 lb

## 2014-11-02 DIAGNOSIS — J329 Chronic sinusitis, unspecified: Secondary | ICD-10-CM | POA: Diagnosis not present

## 2014-11-02 MED ORDER — HYDROCOD POLST-CPM POLST ER 10-8 MG/5ML PO SUER: 5.0000 mL | Freq: Every evening | ORAL | Status: DC | PRN

## 2014-11-02 MED ORDER — AZITHROMYCIN 250 MG PO TABS: ORAL_TABLET | ORAL | Status: DC

## 2014-11-02 NOTE — Progress Notes (Signed)
SUBJECTIVE:  Ray Herrera is a 44 y.o. male who complains of coryza, congestion, post nasal drip, dry cough, bilateral sinus pain and chills for 7 days. He denies a history of anorexia and chest pain and denies a history of asthma. Patient denies smoke cigarettes.   Treated with amoxicillin end of March for "exact same symptoms."   Felt he never really got better and over past 7 days, progressing.  Never started flonase that I sent in for him.  Tussionex has been helping for cough at bedtime but needs a refill. Current Outpatient Prescriptions on File Prior to Visit  Medication Sig Dispense Refill  . chlorpheniramine-HYDROcodone (TUSSIONEX PENNKINETIC ER) 10-8 MG/5ML LQCR Take 5 mLs by mouth at bedtime as needed. 140 mL 0  . Esomeprazole Magnesium (NEXIUM PO) Take by mouth as needed.    . fluticasone (FLONASE) 50 MCG/ACT nasal spray Place 2 sprays into both nostrils daily. 16 g 6   No current facility-administered medications on file prior to visit.    Allergies  Allergen Reactions  . Other Itching    [place] Nuts    Past Medical History  Diagnosis Date  . GERD (gastroesophageal reflux disease)   . Hypertension   . Chronic headaches     Past Surgical History  Procedure Laterality Date  . Distoclavicalectomy      right  . Microdiscectomy lumbar  2011    L5-S1    Family History  Problem Relation Age of Onset  . Colon cancer Neg Hx   . Diabetes Father   . Heart disease Father   . Hypertension Father   . Diabetes Paternal Grandfather   . Heart disease Paternal Grandfather   . Lung cancer Maternal Grandfather   . Cancer      abdominal, maternal great aunt  . Crohn's disease Brother   . Irritable bowel syndrome Father     History   Social History  . Marital Status: Married    Spouse Name: N/A  . Number of Children: 1  . Years of Education: N/A   Occupational History  .  Surveyor, mining   Social History Main Topics  . Smoking status: Never Smoker   .  Smokeless tobacco: Never Used  . Alcohol Use: 0.0 oz/week    0 Standard drinks or equivalent per week     Comment: 2 shots weekly  . Drug Use: No  . Sexual Activity: Not on file   Other Topics Concern  . Not on file   Social History Narrative   Caffeine  Daily   Married to [employer]   Does use anabolic steroids- lifts weight   Works as Radiation protection practitioner               The [place], [place], Social History, Family History, Medications, and allergies have been reviewed in [facility], and have been updated if relevant.  OBJECTIVE: BP 130/82 mmHg  Pulse 67  Temp(Src) 98 F (36.7 C) (Oral)  Wt 244 lb 8 oz (110.904 kg)  SpO2 97%  He appears well, vital signs are as noted. Ears normal.  Throat and pharynx normal.  Neck supple. No adenopathy in the neck. Nose is congested. Sinuses  tender. The chest is clear, without wheezes or rales.  ASSESSMENT:  sinusitis  PLAN: Given duration and progression of symptoms along with possible failure tx to amoxicillin, will treat for bacterial sinusitis with zpack.  Symptomatic therapy suggested: push fluids, rest and return office visit prn if symptoms persist or worsen.  Call or return to clinic prn if these symptoms worsen or fail to improve as anticipated.

## 2014-11-02 NOTE — Progress Notes (Signed)
Pre visit review using our clinic review tool, if applicable. No additional management support is needed unless otherwise documented below in the visit note. 

## 2014-12-08 ENCOUNTER — Encounter: Payer: Self-pay | Admitting: Gastroenterology

## 2014-12-08 ENCOUNTER — Ambulatory Visit (INDEPENDENT_AMBULATORY_CARE_PROVIDER_SITE_OTHER): Payer: BLUE CROSS/BLUE SHIELD | Admitting: Gastroenterology

## 2014-12-08 VITALS — BP 126/84 | HR 76 | Ht 71.5 in | Wt 247.2 lb

## 2014-12-08 DIAGNOSIS — K648 Other hemorrhoids: Secondary | ICD-10-CM | POA: Diagnosis not present

## 2014-12-08 NOTE — Patient Instructions (Signed)
HEMORRHOID BANDING PROCEDURE    FOLLOW-UP CARE   1. The procedure you have had should have been relatively painless since the banding of the area involved does not have nerve endings and there is no pain sensation.  The rubber band cuts off the blood supply to the hemorrhoid and the band may fall off as soon as 48 hours after the banding (the band may occasionally be seen in the toilet bowl following a bowel movement). You may notice a temporary feeling of fullness in the rectum which should respond adequately to plain Tylenol or Motrin.  2. Following the banding, avoid strenuous exercise that evening and resume full activity the next day.  A sitz bath (soaking in a warm tub) or bidet is soothing, and can be useful for cleansing the area after bowel movements.     3. To avoid constipation, take two tablespoons of natural wheat bran, natural oat bran, flax, Benefiber or any over the counter fiber supplement and increase your water intake to 7-8 glasses daily.    4. Unless you have been prescribed anorectal medication, do not put anything inside your rectum for two weeks: No suppositories, enemas, fingers, etc.  5. Occasionally, you may have more bleeding than usual after the banding procedure.  This is often from the untreated hemorrhoids rather than the treated one.  Don't be concerned if there is a tablespoon or so of blood.  If there is more blood than this, lie flat with your bottom higher than your head and apply an ice pack to the area. If the bleeding does not stop within a half an hour or if you feel faint, call our office at [phone] or go to the emergency room.  6. Problems are not common; however, if there is a substantial amount of bleeding, severe pain, chills, fever or difficulty passing urine (very rare) or other problems, you should call us at [phone] or report to the nearest emergency room.  7. Do not stay seated continuously for more than 2-3 hours for a day or two  after the procedure.  Tighten your buttock muscles 10-15 times every two hours and take 10-15 deep breaths every 1-2 hours.  Do not spend more than a few minutes on the toilet if you cannot empty your bowel; instead re-visit the toilet at a later time.   Your 3rd banding is scheduled on 02/19/2015 at 11am

## 2014-12-08 NOTE — Progress Notes (Signed)
PROCEDURE NOTE: The patient presents with symptomatic grade *2**  hemorrhoids, requesting rubber band ligation of his/her hemorrhoidal disease.  All risks, benefits and alternative forms of therapy were described and informed consent was obtained.   The anorectum was pre-medicated with lubricant and nitroglycerine ointment The decision was made to band the *right posterior** internal hemorrhoid, and the [place] was used to perform band ligation without complication.  Digital anorectal examination was then performed to assure proper positioning of the band, and to adjust the banded tissue as required.  The patient was discharged home without pain or other issues.  Dietary and behavioral recommendations were given and along with follow-up instructions.    The patient will return in *4** weeks for  follow-up and possible additional banding as required. No complications were encountered and the patient tolerated the procedure well.

## 2015-02-19 ENCOUNTER — Encounter: Payer: BLUE CROSS/BLUE SHIELD | Admitting: Gastroenterology

## 2015-08-02 ENCOUNTER — Encounter: Payer: Self-pay | Admitting: Family Medicine

## 2015-08-02 ENCOUNTER — Ambulatory Visit (INDEPENDENT_AMBULATORY_CARE_PROVIDER_SITE_OTHER): Payer: BLUE CROSS/BLUE SHIELD | Admitting: Family Medicine

## 2015-08-02 ENCOUNTER — Other Ambulatory Visit: Payer: Self-pay | Admitting: Family Medicine

## 2015-08-02 VITALS — BP 118/82 | HR 80 | Temp 98.1°F | Wt 249.5 lb

## 2015-08-02 DIAGNOSIS — Z23 Encounter for immunization: Secondary | ICD-10-CM

## 2015-08-02 DIAGNOSIS — J069 Acute upper respiratory infection, unspecified: Secondary | ICD-10-CM

## 2015-08-02 DIAGNOSIS — N486 Induration penis plastica: Secondary | ICD-10-CM

## 2015-08-02 MED ORDER — AMOXICILLIN 875 MG PO TABS: 875.0000 mg | ORAL_TABLET | Freq: Two times a day (BID) | ORAL | Status: DC

## 2015-08-02 NOTE — Progress Notes (Signed)
SUBJECTIVE:  Ray Herrera is a 45 y.o. male who complains of coryza, congestion and bilateral sinus pain for 8 days. He denies a history of anorexia, chest pain, dizziness and fevers and denies a history of asthma. Patient denies smoke cigarettes.   Current Outpatient Prescriptions on File Prior to Visit  Medication Sig Dispense Refill  . Esomeprazole Magnesium (NEXIUM PO) Take by mouth as needed.    . fluticasone (FLONASE) 50 MCG/ACT nasal spray Place 2 sprays into both nostrils daily. 16 g 6   No current facility-administered medications on file prior to visit.    Allergies  Allergen Reactions  . Other Itching    [place] Nuts    Past Medical History  Diagnosis Date  . GERD (gastroesophageal reflux disease)   . Hypertension   . Chronic headaches     Past Surgical History  Procedure Laterality Date  . Distoclavicalectomy      right  . Microdiscectomy lumbar  2011    L5-S1    Family History  Problem Relation Age of Onset  . Colon cancer Neg Hx   . Diabetes Father   . Heart disease Father   . Hypertension Father   . Diabetes Paternal Grandfather   . Heart disease Paternal Grandfather   . Lung cancer Maternal Grandfather   . Cancer      abdominal, maternal great aunt  . Crohn's disease Brother   . Irritable bowel syndrome Father     Social History   Social History  . Marital Status: Married    Spouse Name: N/A  . Number of Children: 1  . Years of Education: N/A   Occupational History  .  Surveyor, mining   Social History Main Topics  . Smoking status: Never Smoker   . Smokeless tobacco: Never Used  . Alcohol Use: 0.0 oz/week    0 Standard drinks or equivalent per week     Comment: 2 shots weekly  . Drug Use: No  . Sexual Activity: Not on file   Other Topics Concern  . Not on file   Social History Narrative   Caffeine  Daily   Married to [employer]   Does use anabolic steroids- lifts weight   Works as Radiation protection practitioner               The [place],  [place], Social History, Family History, Medications, and allergies have been reviewed in [facility], and have been updated if relevant.  OBJECTIVE: BP 118/82 mmHg  Pulse 80  Temp(Src) 98.1 F (36.7 C) (Oral)  Wt 249 lb 8 oz (113.172 kg)  SpO2 96%  He appears well, vital signs are as noted. Ears normal.  Throat and pharynx normal.  Neck supple. No adenopathy in the neck. Nose is congested. Sinuses  tender. The chest is clear, without wheezes or rales.  ASSESSMENT:  sinusitis  PLAN: Given duration and progression of symptoms, will treat for bacterial sinusitis with 10 day course of Amoxicillin. Symptomatic therapy suggested: push fluids, rest and return office visit prn if symptoms persist or worsen.Call or return to clinic prn if these symptoms worsen or fail to improve as anticipated.

## 2015-08-02 NOTE — Progress Notes (Signed)
Pre visit review using our clinic review tool, if applicable. No additional management support is needed unless otherwise documented below in the visit note. 

## 2015-08-02 NOTE — Patient Instructions (Signed)
Take antibiotic as directed.  Drink lots of fluids.    Treat sympotmatically with Mucinex, nasal saline irrigation, and Tylenol/Ibuprofen.   Also try an antihistamine/decongestant like claritin D or zyrtec D over the counter- two times a day as needed ( have to sign for them at pharmacy).   Try over the counter nasocort-start with 2 sprays per nostril per day...and then try to taper to 1 spray per nostril once symptoms improve.     Call if not improving as expected in 5-7 days.

## 2015-08-24 ENCOUNTER — Encounter: Payer: Self-pay | Admitting: Urology

## 2015-08-24 ENCOUNTER — Ambulatory Visit (INDEPENDENT_AMBULATORY_CARE_PROVIDER_SITE_OTHER): Payer: BLUE CROSS/BLUE SHIELD | Admitting: Urology

## 2015-08-24 VITALS — BP 126/84 | HR 81 | Ht 73.0 in | Wt 246.2 lb

## 2015-08-24 DIAGNOSIS — R109 Unspecified abdominal pain: Secondary | ICD-10-CM

## 2015-08-24 DIAGNOSIS — N486 Induration penis plastica: Secondary | ICD-10-CM | POA: Diagnosis not present

## 2015-08-24 NOTE — Progress Notes (Signed)
08/24/2015 2:01 PM   Ray Herrera 1970/12/22 [id number]  Referring provider: Lucille Passy, MD [place], [place] [address]  Chief Complaint  Patient presents with  . Abnormal Penile Curvature     referred by Dr. Deborra Medina  . Back Pain    patient states this is kidney inflammation    HPI: 45 yo M dx with Peyroinies diease, flank pain here to establish care  Peyronie's 2 year history of penile curvature to the right, estimates 45 degree curvature.  This has been stable for at least 1-1/2 years. He saw a urologist approximately 2 years ago when he first developed a curvature who recommended waiting until the plaque and stabilized.  No pain with erections (initially had pain).  He is able to penetrate. No ED, able to achieve and maintain erection for penetration and orgasms.  He does note fairly significant loss of penile length associated with development of this curvature.  He does report having a "crush injury" to his penis at work but no bruising or swelling.   He does have bilateral Dupuytren's contractures of the hands.   Bilateral flank pain Reports that he develops bilateral flank pain when drinking EtOH on the weekends (6-12 drinks per night) or when taking antibiotics that he has this pain.  No associated n/v.  Pain first started ~20 year ago but has started increasing frequency.  No dysuria, gross hematuria, or UTIs.  No recent renal imaging.    No voiding complaints today.   PMH: Past Medical History  Diagnosis Date  . GERD (gastroesophageal reflux disease)   . Hypertension   . Chronic headaches   . Heart murmur     Surgical History: Past Surgical History  Procedure Laterality Date  . Distoclavicalectomy      right  . Microdiscectomy lumbar  2011    L5-S1    Home Medications:    Medication List       This list is accurate as of: 08/24/15  2:01 PM.  Always use your most recent med list.               amoxicillin 875 MG tablet  Commonly known  as:  AMOXIL  Take 1 tablet (875 mg total) by mouth 2 (two) times daily.     fluticasone 50 MCG/ACT nasal spray  Commonly known as:  FLONASE  Place 2 sprays into both nostrils daily.     NEXIUM PO  Take by mouth as needed.        Allergies:  Allergies  Allergen Reactions  . Other Itching    [place] Nuts    Family History: Family History  Problem Relation Age of Onset  . Colon cancer Neg Hx   . Diabetes Father   . Heart disease Father   . Hypertension Father   . Diabetes Paternal Grandfather   . Heart disease Paternal Grandfather   . Lung cancer Maternal Grandfather   . Cancer      abdominal, maternal great aunt  . Crohn's disease Brother   . Irritable bowel syndrome Father   . Kidney disease Neg Hx   . Prostate cancer Neg Hx     Social History:  reports that he has never smoked. He has never used smokeless tobacco. He reports that he drinks alcohol. He reports that he does not use illicit drugs.  ROS: UROLOGY Frequent Urination?: No Hard to postpone urination?: Yes Burning/pain with urination?: No Get up at night to urinate?: Yes Leakage  of urine?: No Urine stream starts and stops?: No Trouble starting stream?: No Do you have to strain to urinate?: No Blood in urine?: No Urinary tract infection?: No Sexually transmitted disease?: No Injury to kidneys or bladder?: No Painful intercourse?: No Weak stream?: No Erection problems?: No Penile pain?: No  Gastrointestinal Nausea?: No Vomiting?: No Indigestion/heartburn?: Yes Diarrhea?: No Constipation?: No  Constitutional Fever: No Night sweats?: No Weight loss?: No Fatigue?: Yes  Skin Skin rash/lesions?: No Itching?: No  Eyes Blurred vision?: Yes Double vision?: No  Ears/Nose/Throat Sore throat?: Yes Sinus problems?: Yes  Hematologic/Lymphatic Swollen glands?: No Easy bruising?: No  Cardiovascular Leg swelling?: No Chest pain?: No  Respiratory Cough?: No Shortness of breath?:  No  Endocrine Excessive thirst?: No  Musculoskeletal Back pain?: Yes Joint pain?: Yes  Neurological Headaches?: Yes Dizziness?: No  Psychologic Depression?: No Anxiety?: No  Physical Exam: BP 126/84 mmHg  Pulse 81  Ht 6\' 1"  (1.854 m)  Wt 246 lb 3.2 oz (111.676 kg)  BMI 32.49 kg/m2  Constitutional:  Alert and oriented, No acute distress. HEENT: [place] AT, moist mucus membranes.  Trachea midline, no masses. Cardiovascular: No clubbing, cyanosis, or edema. Respiratory: Normal respiratory effort, no increased work of breathing. GI: Abdomen is soft, nontender, nondistended, no abdominal masses GU: No CVA tenderness. PAlpable penile plaque at left lateral base (0.5 cm) and right lateral midshaft (2 cm), concise phallus. Bilateral descended testicles, no masses, nontender. Skin: No rashes, bruises or suspicious lesions. Lymph: No cervical or inguinal adenopathy. Neurologic: Grossly intact, no focal deficits, moving all 4 extremities. Psychiatric: Normal mood and affect.  Laboratory Data: Lab Results  Component Value Date   WBC 9.5 03/17/2013   HGB 17.2* 03/17/2013   HCT 51.7 03/17/2013   MCV 81.9 03/17/2013   PLT 239.0 03/17/2013    Lab Results  Component Value Date   CREATININE 1.3 03/17/2013    Lab Results  Component Value Date   PSA 0.97 03/17/2013    Lab Results  Component Value Date   TESTOSTERONE 1005* 03/17/2013    Lab Results  Component Value Date   HGBA1C 5.5 03/17/2013    Pertinent Imaging: n/a  Assessment & Plan:    1. Peyronie disease Significant Peyronie's disease with associated Dupuytren's contractures. Patient endorses 45 angle of curvature which is bothersome to him but able to achieve have without ED or pain.    Options for management of his disease were discussed in detail today. This included penile plication, plaquing graft, injections of penile collagenase, and conservative management with observation. The risks and benefits of each  of these were discussed in detail.  He seems most interested in collagenase injections.  Additional literature was given today about the injections.  He will let us know how would like to proceed and contact our office at that time. If he is interested in proceeding with this, we will arrange for prior authorization with his insurance company and arrange for his first round of injections with induction of erection in the office to ensure that he does indeed meet the criteria.  Also discussed my personal experience today with collagenase injections.   2. Bilateral flank pain Bilateral flank pain with significant fluid load during bench drinking. We discussed the possible physiology of this and I encouraged him to avoid bench drinking as possible as this is not only and healthy for his liver and overall physical well-being, but also creates high fluid load and demand on his kidneys. I have recommended proceeding with renal  ultrasound images kidneys to assess for possible bilateral UPJ obstruction, however, I suspect that this is unlikely. - US Renal; Future  Will call with renal ultrasound results. Patient to call us and let us know if he like to proceed with collagenase injections at which time we will workup the details of prior authorization and scheduling.   Hollice Espy, MD  [facility] [address], [place], [place] [address] [phone]  I spent 45 min with this patient of which greater than 50% was spent in counseling and coordination of care with the patient.

## 2015-08-26 ENCOUNTER — Encounter: Payer: Self-pay | Admitting: Urology

## 2015-09-07 ENCOUNTER — Ambulatory Visit
Admission: RE | Admit: 2015-09-07 | Discharge: 2015-09-07 | Disposition: A | Discharge status: Home / Self Care | Payer: BLUE CROSS/BLUE SHIELD | Source: Ambulatory Visit | Attending: Urology | Admitting: Urology

## 2015-09-07 DIAGNOSIS — N329 Bladder disorder, unspecified: Secondary | ICD-10-CM | POA: Diagnosis not present

## 2015-09-07 DIAGNOSIS — N486 Induration penis plastica: Secondary | ICD-10-CM | POA: Diagnosis not present

## 2015-09-18 ENCOUNTER — Telehealth: Payer: Self-pay

## 2015-09-18 NOTE — Telephone Encounter (Signed)
Patient notified/SW 

## 2015-09-18 NOTE — Telephone Encounter (Signed)
-----   Message from Hollice Espy, MD sent at 09/17/2015  5:26 PM EDT ----- Please let this patient known that his renal ultrasound looks fine. He did have a mildly thickened bladder which sometimes can be due to a fairly empty bladder at the time of ultrasound. If he does develop any urinary symptoms or blood in the urine, he needs to return ASAP.  Hollice Espy, MD

## 2015-09-18 NOTE — Telephone Encounter (Signed)
Left pt message to call/SW 

## 2016-08-05 ENCOUNTER — Encounter: Payer: Self-pay | Admitting: Family Medicine

## 2016-08-05 ENCOUNTER — Ambulatory Visit (INDEPENDENT_AMBULATORY_CARE_PROVIDER_SITE_OTHER): Payer: BLUE CROSS/BLUE SHIELD | Admitting: Family Medicine

## 2016-08-05 ENCOUNTER — Other Ambulatory Visit: Payer: Self-pay | Admitting: Family Medicine

## 2016-08-05 VITALS — BP 112/76 | HR 61 | Temp 98.0°F | Wt 246.5 lb

## 2016-08-05 DIAGNOSIS — J40 Bronchitis, not specified as acute or chronic: Secondary | ICD-10-CM

## 2016-08-05 MED ORDER — HYDROCOD POLST-CPM POLST ER 10-8 MG/5ML PO SUER: 5.0000 mL | Freq: Two times a day (BID) | ORAL | 0 refills | Status: DC | PRN

## 2016-08-05 MED ORDER — AZITHROMYCIN 250 MG PO TABS: ORAL_TABLET | ORAL | 0 refills | Status: DC

## 2016-08-05 NOTE — Progress Notes (Signed)
SUBJECTIVE:  Ray Herrera is a 46 y.o. male who complains of congestion, dry cough and productive cough for 14 days. He denies a history of anorexia and chest pain and denies a history of asthma. Patient denies smoke cigarettes.   Current Outpatient Prescriptions on File Prior to Visit  Medication Sig Dispense Refill  . Esomeprazole Magnesium (NEXIUM PO) Take by mouth as needed.    . fluticasone (FLONASE) 50 MCG/ACT nasal spray Place 2 sprays into both nostrils daily. 16 g 6   No current facility-administered medications on file prior to visit.     Allergies  Allergen Reactions  . Other Itching    [place] Nuts    Past Medical History:  Diagnosis Date  . Chronic headaches   . GERD (gastroesophageal reflux disease)   . Heart murmur   . Hypertension     Past Surgical History:  Procedure Laterality Date  . distoclavicalectomy     right  . MICRODISCECTOMY LUMBAR  2011   L5-S1    Family History  Problem Relation Age of Onset  . Colon cancer Neg Hx   . Diabetes Father   . Heart disease Father   . Hypertension Father   . Diabetes Paternal Grandfather   . Heart disease Paternal Grandfather   . Lung cancer Maternal Grandfather   . Cancer      abdominal, maternal great aunt  . Crohn's disease Brother   . Irritable bowel syndrome Father   . Kidney disease Neg Hx   . Prostate cancer Neg Hx     Social History   Social History  . Marital status: Married    Spouse name: N/A  . Number of children: 1  . Years of education: N/A   Occupational History  .  Surveyor, mining   Social History Main Topics  . Smoking status: Never Smoker  . Smokeless tobacco: Never Used  . Alcohol use 0.0 oz/week     Comment: 2 shots weekly  . Drug use: No  . Sexual activity: Not on file   Other Topics Concern  . Not on file   Social History Narrative   Caffeine  Daily   Married to [employer]   Does use anabolic steroids- lifts weight   Works as Radiation protection practitioner                [place], [place], Social History, Family History, Medications, and allergies have been reviewed in [facility], and have been updated if relevant.  OBJECTIVE: BP 112/76   Pulse 61   Temp 98 F (36.7 C) (Oral)   Wt 246 lb 8 oz (111.8 kg)   SpO2 98%   BMI 32.52 kg/m   He appears well, vital signs are as noted. Ears normal.  Throat and pharynx normal.  Neck supple. No adenopathy in the neck. Nose is congested. Sinuses non tender. Scattered right sided wheezes.  ASSESSMENT:  bronchitis  PLAN: zpack as directed. Symptomatic therapy suggested: push fluids, rest and return office visit prn if symptoms persist or worsen. . Call or return to clinic prn if these symptoms worsen or fail to improve as anticipated.

## 2016-08-05 NOTE — Patient Instructions (Signed)
Great to see you.  Take zpack as directed.

## 2016-08-05 NOTE — Progress Notes (Signed)
Pre visit review using our clinic review tool, if applicable. No additional management support is needed unless otherwise documented below in the visit note. 

## 2016-08-17 ENCOUNTER — Encounter: Payer: Self-pay | Admitting: Family Medicine

## 2016-11-05 ENCOUNTER — Other Ambulatory Visit: Payer: Self-pay | Admitting: Family Medicine

## 2017-07-14 ENCOUNTER — Telehealth: Payer: Self-pay | Admitting: Internal Medicine

## 2017-07-14 ENCOUNTER — Encounter: Payer: Self-pay | Admitting: Internal Medicine

## 2017-07-14 ENCOUNTER — Ambulatory Visit (INDEPENDENT_AMBULATORY_CARE_PROVIDER_SITE_OTHER): Payer: BLUE CROSS/BLUE SHIELD | Admitting: Internal Medicine

## 2017-07-14 VITALS — BP 126/84 | HR 68 | Temp 98.2°F | Wt 258.0 lb

## 2017-07-14 DIAGNOSIS — G44209 Tension-type headache, unspecified, not intractable: Secondary | ICD-10-CM

## 2017-07-14 MED ORDER — CYCLOBENZAPRINE HCL 10 MG PO TABS: 10.0000 mg | ORAL_TABLET | Freq: Every day | ORAL | 0 refills | Status: DC

## 2017-07-14 MED ORDER — KETOROLAC TROMETHAMINE 30 MG/ML IJ SOLN
30.0000 mg | Freq: Once | INTRAMUSCULAR | Status: AC
  Administered 2017-07-14: 30 mg via INTRAMUSCULAR

## 2017-07-14 NOTE — Telephone Encounter (Signed)
Per office note 07/14/17 eRX for Flexeril 10 mg po qhs. Sent to Avie Echevaria NP for # qty.Please advise.

## 2017-07-14 NOTE — Patient Instructions (Signed)
Tension Headache A tension headache is pain, pressure, or aching that is felt over the front and sides of your head. These headaches can last from 30 minutes to several days. Follow these instructions at home: Managing pain  Take over-the-counter and prescription medicines only as told by your doctor.  Lie down in a dark, quiet room when you have a headache.  If directed, apply ice to your head and neck area: ? Put ice in a plastic bag. ? Place a towel between your skin and the bag. ? Leave the ice on for 20 minutes, 2-3 times per day.  Use a heating pad or a hot shower to apply heat to your head and neck area as told by your doctor. Eating and drinking  Eat meals on a regular schedule.  Do not drink a lot of alcohol.  Do not use a lot of caffeine, or stop using caffeine. General instructions  Keep all follow-up visits as told by your doctor. This is important.  Keep a journal to find out if certain things bring on headaches. For example, write down: ? What you eat and drink. ? How much sleep you get. ? Any change to your diet or medicines.  Try getting a massage, or doing other things that help you to relax.  Lessen stress.  Sit up straight. Do not tighten (tense) your muscles.  Do not use tobacco products. This includes cigarettes, chewing tobacco, or e-cigarettes. If you need help quitting, ask your doctor.  Exercise regularly as told by your doctor.  Get enough sleep. This may mean 7-9 hours of sleep. Contact a doctor if:  Your symptoms are not helped by medicine.  You have a headache that feels different from your usual headache.  You feel sick to your stomach (nauseous) or you throw up (vomit).  You have a fever. Get help right away if:  Your headache becomes very bad.  You keep throwing up.  You have a stiff neck.  You have trouble seeing.  You have trouble speaking.  You have pain in your eye or ear.  Your muscles are weak or you lose muscle  control.  You lose your balance or you have trouble walking.  You feel like you will pass out (faint) or you pass out.  You have confusion. This information is not intended to replace advice given to you by your health care provider. Make sure you discuss any questions you have with your health care provider. Document Released: 09/10/2009 Document Revised: 02/14/2016 Document Reviewed: 10/09/2014 Elsevier Interactive Patient Education  2018 Elsevier Inc.  

## 2017-07-14 NOTE — Progress Notes (Signed)
Subjective:    Patient ID: Ray Herrera, male    DOB: 1970/07/11, 47 y.o.   MRN: [id number]  HPI  Pt presents to the clinic today with c/o a headache. He reports this started 3 days ago. The pain is started at the base of his skull, radiated up to the top of his head and settled behind his right eye. He describes the pain as throbbing. He denies dizziness, visual changes, sensitivity to light or sound, nausea or vomiting. He does feel like wearing his glasses makes his headache worse. His last eye exam was a few months ago. He denies any head injury. He has taken Ibuprofen with some relief. He has a history of chronic headaches and he reports although this one is different, it is not the worse headache he has ever had.   Review of Systems      Past Medical History:  Diagnosis Date  . Chronic headaches   . GERD (gastroesophageal reflux disease)   . Heart murmur   . Hypertension     Current Outpatient Medications  Medication Sig Dispense Refill  . Esomeprazole Magnesium (NEXIUM PO) Take by mouth as needed.    . fluticasone (FLONASE) 50 MCG/ACT nasal spray PLACE 2 SPRAYS INTO BOTH NOSTRILS DAILY. 16 g 2   No current facility-administered medications for this visit.     Allergies  Allergen Reactions  . Other Itching    [place] Nuts    Family History  Problem Relation Age of Onset  . Colon cancer Neg Hx   . Diabetes Father   . Heart disease Father   . Hypertension Father   . Diabetes Paternal Grandfather   . Heart disease Paternal Grandfather   . Lung cancer Maternal Grandfather   . Cancer Unknown        abdominal, maternal great aunt  . Crohn's disease Brother   . Irritable bowel syndrome Father   . Kidney disease Neg Hx   . Prostate cancer Neg Hx     Social History   Socioeconomic History  . Marital status: Married    Spouse name: Not on file  . Number of children: 1  . Years of education: Not on file  . Highest education level: Not on file  Social Needs    . Financial resource strain: Not on file  . Food insecurity - worry: Not on file  . Food insecurity - inability: Not on file  . Transportation needs - medical: Not on file  . Transportation needs - non-medical: Not on file  Occupational History    Employer: [employer]  Tobacco Use  . Smoking status: Never Smoker  . Smokeless tobacco: Never Used  Substance and Sexual Activity  . Alcohol use: Yes    Alcohol/week: 0.0 oz    Comment: 2 shots weekly  . Drug use: No  . Sexual activity: Not on file  Other Topics Concern  . Not on file  Social History Narrative   Caffeine  Daily   Married to [employer]   Does use anabolic steroids- lifts weight   Works as Radiation protection practitioner              Constitutional: Pt reports headache. Denies fever, malaise, fatigue, or abrupt weight changes.  Musculoskeletal: Denies decrease in range of motion, difficulty with gait, muscle pain or joint pain and swelling.  Neurological: Denies dizziness, difficulty with memory, difficulty with speech or problems with balance and coordination.    No other specific complaints in  a complete review of systems (except as listed in HPI above).  Objective:   Physical Exam  BP 126/84   Pulse 68   Temp 98.2 F (36.8 C) (Oral)   Wt 258 lb (117 kg)   SpO2 97%   BMI 34.04 kg/m  Wt Readings from Last 3 Encounters:  07/14/17 258 lb (117 kg)  08/05/16 246 lb 8 oz (111.8 kg)  08/24/15 246 lb 3.2 oz (111.7 kg)    General: Appears his stated age,in NAD. Skin: Warm, dry and intact. No rashes, lnoted. HEENT: Head: normal shape and size; Eyes: sclera white, no icterus, conjunctiva pink, PERRLA and EOMs intact; Ears: Tm's gray and intact, normal light reflex;  Musculoskeletal: Normal flexion, extension and rotation of the cervical spine. Pain with rotation to the right. No bony tenderness noted over the spine   Neurological: Alert and oriented.  Coordination normal.   BMET    Component Value Date/Time   NA  138 03/17/2013 1149   K 5.1 03/17/2013 1149   CL 105 03/17/2013 1149   CO2 27 03/17/2013 1149   GLUCOSE 86 03/17/2013 1149   BUN 11 03/17/2013 1149   CREATININE 1.3 03/17/2013 1149   CALCIUM 10.1 03/17/2013 1149   GFRNONAA >60 03/14/2009 1253   GFRAA  03/14/2009 1253    >60        The eGFR has been calculated using the MDRD equation. This calculation has not been validated in all clinical situations. eGFR's persistently <60 mL/min signify possible Chronic Kidney Disease.    Lipid Panel     Component Value Date/Time   CHOL 187 03/17/2013 1149   TRIG 36.0 03/17/2013 1149   HDL 41.90 03/17/2013 1149   CHOLHDL 4 03/17/2013 1149   VLDL 7.2 03/17/2013 1149   LDLCALC 138 (H) 03/17/2013 1149    CBC    Component Value Date/Time   WBC 9.5 03/17/2013 1149   RBC 6.32 (H) 03/17/2013 1149   HGB 17.2 (H) 03/17/2013 1149   HCT 51.7 03/17/2013 1149   PLT 239.0 03/17/2013 1149   MCV 81.9 03/17/2013 1149   MCHC 33.3 03/17/2013 1149   RDW 16.7 (H) 03/17/2013 1149   LYMPHSABS 2.3 03/17/2013 1149   MONOABS 0.6 03/17/2013 1149   EOSABS 0.1 03/17/2013 1149   BASOSABS 0.0 03/17/2013 1149    Hgb A1C Lab Results  Component Value Date   HGBA1C 5.5 03/17/2013            Assessment & Plan:   Headache:  Toradol 30 mg IM today eRx for Flexeril 10 mg PO QHS Encouraged heat and stretching If no improvement, let me know  Return /ER precautions discussed Webb Silversmith, NP

## 2017-07-14 NOTE — Telephone Encounter (Signed)
Copied from [place]. Topic: Quick Communication - See Telephone Encounter >> Jul 14, 2017  2:55 PM Ether Griffins B wrote: CRM for notification. See Telephone encounter for:  Pt seen Ray Herrera today and she told him she would call in a muscle relaxer for him and it still hasnt been received by the pharmacy.  07/14/17.

## 2017-07-14 NOTE — Addendum Note (Signed)
Addended by: Lurlean Nanny on: 07/14/2017 12:50 PM   Modules accepted: Orders

## 2017-07-14 NOTE — Telephone Encounter (Signed)
Sent flexeril to pharmacy

## 2017-07-15 ENCOUNTER — Other Ambulatory Visit: Payer: Self-pay | Admitting: Internal Medicine

## 2017-07-15 ENCOUNTER — Telehealth: Payer: Self-pay | Admitting: Internal Medicine

## 2017-07-15 MED ORDER — ACETAMINOPHEN-CODEINE 300-30 MG PO TABS: 1.0000 | ORAL_TABLET | Freq: Three times a day (TID) | ORAL | 0 refills | Status: DC | PRN

## 2017-07-15 NOTE — Telephone Encounter (Signed)
Has he been taking the muscle relaxer? Any Ibuprofen OTC?

## 2017-07-15 NOTE — Telephone Encounter (Signed)
Copied from [place] [id number]. Topic: Quick Communication - See Telephone Encounter >> Jul 15, 2017 11:00 AM Ether Griffins B wrote: CRM for notification. See Telephone encounter for:  Pt calling back about headaches. He is out of work again today. And was told to call back if symptoms didn't go away. He states the shot he received yesterday helped but once it wore off last night his headache was right back .  07/15/17.

## 2017-07-15 NOTE — Telephone Encounter (Signed)
Pt is aware as instructed and expressed understanding 

## 2017-07-15 NOTE — Telephone Encounter (Signed)
I will send him in some Tylenol #3, to see if we can knock this out.

## 2017-07-15 NOTE — Telephone Encounter (Signed)
Pt reports he has taken 2 muscle relaxers and ibuprofen once because of his GERD he does not take nsaids often.... Pt also reports he feels worse than he did yesterday.Ray KitchenMarland Herrera

## 2017-07-29 ENCOUNTER — Encounter: Payer: Self-pay | Admitting: Family Medicine

## 2017-07-29 ENCOUNTER — Ambulatory Visit (INDEPENDENT_AMBULATORY_CARE_PROVIDER_SITE_OTHER): Payer: BLUE CROSS/BLUE SHIELD | Admitting: Family Medicine

## 2017-07-29 VITALS — BP 150/96 | HR 75 | Temp 97.6°F | Wt 262.4 lb

## 2017-07-29 DIAGNOSIS — S161XXA Strain of muscle, fascia and tendon at neck level, initial encounter: Secondary | ICD-10-CM | POA: Diagnosis not present

## 2017-07-29 MED ORDER — PREDNISONE 10 MG PO TABS: ORAL_TABLET | ORAL | 0 refills | Status: DC

## 2017-07-29 MED ORDER — METHOCARBAMOL 750 MG PO TABS: 750.0000 mg | ORAL_TABLET | Freq: Three times a day (TID) | ORAL | 0 refills | Status: DC | PRN
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
## 2017-07-29 NOTE — Patient Instructions (Signed)
I have sent a muscle relaxer and prednisone to your pharmacy Start prednisone today with food and full glass of water Do not take other anti-inflammatories- Alleve, ibuprofen, Advil, etc.   Cervical Strain and Sprain Rehab Ask your health care provider which exercises are safe for you. Do exercises exactly as told by your health care provider and adjust them as directed. It is normal to feel mild stretching, pulling, tightness, or discomfort as you do these exercises, but you should stop right away if you feel sudden pain or your pain gets worse.Do not begin these exercises until told by your health care provider. Stretching and range of motion exercises These exercises warm up your muscles and joints and improve the movement and flexibility of your neck. These exercises also help to relieve pain, numbness, and tingling. Exercise A: Cervical side bend  1. Using good posture, sit on a stable chair or stand up. 2. Without moving your shoulders, slowly tilt your left / right ear to your shoulder until you feel a stretch in your neck muscles. You should be looking straight ahead. 3. Hold for __________ seconds. 4. Repeat with the other side of your neck. Repeat __________ times. Complete this exercise __________ times a day. Exercise B: Cervical rotation  1. Using good posture, sit on a stable chair or stand up. 2. Slowly turn your head to the side as if you are looking over your left / right shoulder. ? Keep your eyes level with the ground. ? Stop when you feel a stretch along the side and the back of your neck. 3. Hold for __________ seconds. 4. Repeat this by turning to your other side. Repeat __________ times. Complete this exercise __________ times a day. Exercise C: Thoracic extension and pectoral stretch 1. Roll a towel or a small blanket so it is about 4 inches (10 cm) in diameter. 2. Lie down on your back on a firm surface. 3. Put the towel lengthwise, under your spine in the middle  of your back. It should not be not under your shoulder blades. The towel should line up with your spine from your middle back to your lower back. 4. Put your hands behind your head and let your elbows fall out to your sides. 5. Hold for __________ seconds. Repeat __________ times. Complete this exercise __________ times a day. Strengthening exercises These exercises build strength and endurance in your neck. Endurance is the ability to use your muscles for a long time, even after your muscles get tired. Exercise D: Upper cervical flexion, isometric 1. Lie on your back with a thin pillow behind your head and a small rolled-up towel under your neck. 2. Gently tuck your chin toward your chest and nod your head down to look toward your feet. Do not lift your head off the pillow. 3. Hold for __________ seconds. 4. Release the tension slowly. Relax your neck muscles completely before you repeat this exercise. Repeat __________ times. Complete this exercise __________ times a day. Exercise E: Cervical extension, isometric  1. Stand about 6 inches (15 cm) away from a wall, with your back facing the wall. 2. Place a soft object, about 6-8 inches (15-20 cm) in diameter, between the back of your head and the wall. A soft object could be a small pillow, a ball, or a folded towel. 3. Gently tilt your head back and press into the soft object. Keep your jaw and forehead relaxed. 4. Hold for __________ seconds. 5. Release the tension slowly. Relax your  neck muscles completely before you repeat this exercise. Repeat __________ times. Complete this exercise __________ times a day. Posture and body mechanics  Body mechanics refers to the movements and positions of your body while you do your daily activities. Posture is part of body mechanics. Good posture and healthy body mechanics can help to relieve stress in your body's tissues and joints. Good posture means that your spine is in its natural S-curve position  (your spine is neutral), your shoulders are pulled back slightly, and your head is not tipped forward. The following are general guidelines for applying improved posture and body mechanics to your everyday activities. Standing  When standing, keep your spine neutral and keep your feet about hip-width apart. Keep a slight bend in your knees. Your ears, shoulders, and hips should line up.  When you do a task in which you stand in one place for a long time, place one foot up on a stable object that is 2-4 inches (5-10 cm) high, such as a footstool. This helps keep your spine neutral. Sitting   When sitting, keep your spine neutral and your keep feet flat on the floor. Use a footrest, if necessary, and keep your thighs parallel to the floor. Avoid rounding your shoulders, and avoid tilting your head forward.  When working at a desk or a computer, keep your desk at a height where your hands are slightly lower than your elbows. Slide your chair under your desk so you are close enough to maintain good posture.  When working at a computer, place your monitor at a height where you are looking straight ahead and you do not have to tilt your head forward or downward to look at the screen. Resting When lying down and resting, avoid positions that are most painful for you. Try to support your neck in a neutral position. You can use a contour pillow or a small rolled-up towel. Your pillow should support your neck but not push on it. This information is not intended to replace advice given to you by your health care provider. Make sure you discuss any questions you have with your health care provider. Document Released: 06/16/2005 Document Revised: 02/21/2016 Document Reviewed: 05/23/2015 Elsevier Interactive Patient Education  [employer].

## 2017-07-30 ENCOUNTER — Telehealth: Payer: Self-pay

## 2017-07-30 NOTE — Telephone Encounter (Signed)
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 Raeford Razor in the morning after calming him down/he said he will continue to see you in the future/thx dmf

## 2017-07-30 NOTE — Telephone Encounter (Signed)
I agree that it appears he was seen for this by Debbie yesterday.  Please call pt to verify.  Thanks!

## 2017-07-30 NOTE — Telephone Encounter (Signed)
No problem.

## 2017-07-30 NOTE — Telephone Encounter (Signed)
TA-Plz see pt request below/but looks like he was seen yesterday by Clarene Reamer and was treated? Plz advise/thx dmf   Copied from [place]. Topic: Referral - Request >> Jul 30, 2017  1:03 PM Hewitt Shorts wrote: Reason for CRM:pt is requesting from Dr. Deborra Medina a referral for his neck pain -he saw her last in 07/2016 at [place] and now she is with [facility] and we would ike to know if he needs a visit or can a referral be done for him  Best number [phone]

## 2017-07-30 NOTE — Telephone Encounter (Signed)
Thank you so much, Michele!

## 2017-07-31 ENCOUNTER — Ambulatory Visit (INDEPENDENT_AMBULATORY_CARE_PROVIDER_SITE_OTHER): Payer: BLUE CROSS/BLUE SHIELD | Admitting: Family Medicine

## 2017-07-31 ENCOUNTER — Encounter: Payer: Self-pay | Admitting: Family Medicine

## 2017-07-31 ENCOUNTER — Ambulatory Visit (INDEPENDENT_AMBULATORY_CARE_PROVIDER_SITE_OTHER): Payer: BLUE CROSS/BLUE SHIELD

## 2017-07-31 VITALS — BP 148/96 | HR 78 | Temp 98.3°F | Ht 73.0 in | Wt 259.0 lb

## 2017-07-31 DIAGNOSIS — M542 Cervicalgia: Secondary | ICD-10-CM

## 2017-07-31 NOTE — Patient Instructions (Signed)
Please try the exercises once your pain is less than a 2/10  You can try things like aspercream with lidocaine or capsaicin We will call you with the results from today  Follow up with me in 4-6 weeks if your symptoms do not improve or they worsen

## 2017-07-31 NOTE — Assessment & Plan Note (Signed)
Pain likely the result of spasm. Doesn't appear to be nerve related with no radicular symptoms.  - trigger point injections today  - counseled on HEP  - continue with muscle relaxer  - neck xrays  - if no improvement then try PT or MRI
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
## 2017-10-27 ENCOUNTER — Ambulatory Visit (INDEPENDENT_AMBULATORY_CARE_PROVIDER_SITE_OTHER): Payer: BLUE CROSS/BLUE SHIELD | Admitting: Family Medicine

## 2017-10-27 VITALS — BP 130/90 | HR 79 | Temp 99.1°F | Ht 72.0 in | Wt 251.4 lb

## 2017-10-27 DIAGNOSIS — M545 Low back pain: Secondary | ICD-10-CM | POA: Diagnosis not present

## 2017-10-27 DIAGNOSIS — Z9889 Other specified postprocedural states: Secondary | ICD-10-CM

## 2017-10-27 DIAGNOSIS — Z125 Encounter for screening for malignant neoplasm of prostate: Secondary | ICD-10-CM | POA: Diagnosis not present

## 2017-10-27 DIAGNOSIS — Z0000 Encounter for general adult medical examination without abnormal findings: Secondary | ICD-10-CM

## 2017-10-27 DIAGNOSIS — G8929 Other chronic pain: Secondary | ICD-10-CM

## 2017-10-27 DIAGNOSIS — M549 Dorsalgia, unspecified: Secondary | ICD-10-CM | POA: Insufficient documentation

## 2017-10-27 DIAGNOSIS — Z13220 Encounter for screening for lipoid disorders: Secondary | ICD-10-CM

## 2017-10-27 LAB — COMPREHENSIVE METABOLIC PANEL
ALBUMIN: 4.6 g/dL (ref 3.5–5.2)
ALT: 18 U/L (ref 0–53)
AST: 16 U/L (ref 0–37)
Alkaline Phosphatase: 72 U/L (ref 39–117)
BILIRUBIN TOTAL: 0.4 mg/dL (ref 0.2–1.2)
BUN: 15 mg/dL (ref 6–23)
CALCIUM: 9.7 mg/dL (ref 8.4–10.5)
CO2: 28 mEq/L (ref 19–32)
Chloride: 104 mEq/L (ref 96–112)
Creatinine, Ser: 1.04 mg/dL (ref 0.40–1.50)
GFR: 81.73 mL/min (ref >60.00)
Glucose, Bld: 93 mg/dL (ref 70–99)
Potassium: 4.5 mEq/L (ref 3.5–5.1)
Sodium: 141 mEq/L (ref 135–145)
Total Protein: 7.4 g/dL (ref 6.0–8.3)

## 2017-10-27 LAB — LIPID PANEL
Cholesterol: 161 mg/dL (ref 0–200)
HDL: 57.2 mg/dL (ref >39.00)
LDL Cholesterol: 88 mg/dL (ref 0–99)
NonHDL: 104.19
TRIGLYCERIDES: 79 mg/dL (ref 0.0–149.0)
Total CHOL/HDL Ratio: 3
VLDL: 15.8 mg/dL (ref 0.0–40.0)

## 2017-10-27 LAB — PSA: PSA: 0.64 ng/mL (ref 0.10–4.00)
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
## 2017-10-27 NOTE — Patient Instructions (Signed)
Great to see you. I will call you with your lab results from today and you can view them online.   

## 2017-10-27 NOTE — Assessment & Plan Note (Signed)
Followed by Dr. Raeford Razor.

## 2017-10-27 NOTE — Assessment & Plan Note (Signed)
Reviewed preventive care protocols, scheduled due services, and updated immunizations Discussed nutrition, exercise, diet, and healthy lifestyle.  Orders Placed This Encounter  Procedures  . Comprehensive metabolic panel  . Lipid panel  . PSA

## 2018-02-02 ENCOUNTER — Ambulatory Visit: Payer: Self-pay | Admitting: Family Medicine

## 2018-02-02 ENCOUNTER — Emergency Department
Admission: EM | Admit: 2018-02-02 | Discharge: 2018-02-02 | Disposition: A | Discharge status: Home / Self Care | Payer: BLUE CROSS/BLUE SHIELD | Attending: Emergency Medicine | Admitting: Emergency Medicine

## 2018-02-02 ENCOUNTER — Encounter: Payer: Self-pay | Admitting: Emergency Medicine

## 2018-02-02 ENCOUNTER — Emergency Department: Payer: BLUE CROSS/BLUE SHIELD

## 2018-02-02 DIAGNOSIS — Z79899 Other long term (current) drug therapy: Secondary | ICD-10-CM | POA: Insufficient documentation

## 2018-02-02 DIAGNOSIS — R0602 Shortness of breath: Secondary | ICD-10-CM | POA: Diagnosis not present

## 2018-02-02 DIAGNOSIS — I10 Essential (primary) hypertension: Secondary | ICD-10-CM | POA: Diagnosis not present

## 2018-02-02 DIAGNOSIS — R079 Chest pain, unspecified: Secondary | ICD-10-CM | POA: Diagnosis not present

## 2018-02-02 DIAGNOSIS — R0789 Other chest pain: Secondary | ICD-10-CM | POA: Diagnosis not present

## 2018-02-02 LAB — BASIC METABOLIC PANEL
ANION GAP: 8 (ref 5–15)
BUN: 17 mg/dL (ref 6–20)
CALCIUM: 9.3 mg/dL (ref 8.9–10.3)
CO2: 26 mmol/L (ref 22–32)
Chloride: 106 mmol/L (ref 98–111)
Creatinine, Ser: 1.09 mg/dL (ref 0.61–1.24)
GFR calc non Af Amer: >60 mL/min (ref >60)
Glucose, Bld: 97 mg/dL (ref 70–99)
POTASSIUM: 3.8 mmol/L (ref 3.5–5.1)
Sodium: 140 mmol/L (ref 135–145)

## 2018-02-02 LAB — TROPONIN I

## 2018-02-02 LAB — CBC
HCT: 43.2 % (ref 40.0–52.0)
Hemoglobin: 14.9 g/dL (ref 13.0–18.0)
MCH: 27.5 pg (ref 26.0–34.0)
MCHC: 34.4 g/dL (ref 32.0–36.0)
MCV: 80 fL (ref 80.0–100.0)
Platelets: 191 10*3/uL (ref 150–440)
RBC: 5.4 MIL/uL (ref 4.40–5.90)
RDW: 15.1 % — ABNORMAL HIGH (ref 11.5–14.5)
WBC: 9 10*3/uL (ref 3.8–10.6)

## 2018-02-02 LAB — BRAIN NATRIURETIC PEPTIDE: B Natriuretic Peptide: 17 pg/mL (ref 0.0–100.0)

## 2018-02-02 MED ORDER — KETOROLAC TROMETHAMINE 30 MG/ML IJ SOLN: 15.0000 mg | Freq: Once | INTRAMUSCULAR | Status: DC

## 2018-02-02 MED ORDER — IOPAMIDOL (ISOVUE-370) INJECTION 76%
75.0000 mL | Freq: Once | INTRAVENOUS | Status: AC | PRN
  Administered 2018-02-02: 75 mL via INTRAVENOUS

## 2018-02-02 NOTE — ED Triage Notes (Signed)
Pt reports yesterday he got tickled at something and when he was laughing he felt some tightness in his chest. 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

## 2018-02-02 NOTE — Discharge Instructions (Signed)
Fortunately today your EKG, your chest x-ray, your blood work, and your CT scan were very reassuring.  Please take the day off work and follow-up with your primary care physician within 1 week for a recheck.  Return to the emergency department sooner for any concerns.  It was a pleasure to take care of you today, and thank you for coming to our emergency department.  If you have any questions or concerns before leaving please ask the nurse to grab me and I'm more than happy to go through your aftercare instructions again.  If you were prescribed any opioid pain medication today such as Norco, Vicodin, Percocet, morphine, hydrocodone, or oxycodone please make sure you do not drive when you are taking this medication as it can alter your ability to drive safely.  If you have any concerns once you are home that you are not improving or are in fact getting worse before you can make it to your follow-up appointment, please do not hesitate to call 911 and come back for further evaluation.  Darel Hong, MD  Results for orders placed or performed during the hospital encounter of 56/43/32  Basic metabolic panel  Result Value Ref Range   Sodium 140 135 - 145 mmol/L   Potassium 3.8 3.5 - 5.1 mmol/L   Chloride 106 98 - 111 mmol/L   CO2 26 22 - 32 mmol/L   Glucose, Bld 97 70 - 99 mg/dL   BUN 17 6 - 20 mg/dL   Creatinine, Ser 1.09 0.61 - 1.24 mg/dL   Calcium 9.3 8.9 - 10.3 mg/dL   GFR calc non Af Amer >60 >60 mL/min   GFR calc Af Amer >60 >60 mL/min   Anion gap 8 5 - 15  CBC  Result Value Ref Range   WBC 9.0 3.8 - 10.6 K/uL   RBC 5.40 4.40 - 5.90 MIL/uL   Hemoglobin 14.9 13.0 - 18.0 g/dL   HCT 43.2 40.0 - 52.0 %   MCV 80.0 80.0 - 100.0 fL   MCH 27.5 26.0 - 34.0 pg   MCHC 34.4 32.0 - 36.0 g/dL   RDW 15.1 (H) 11.5 - 14.5 %   Platelets 191 150 - 440 K/uL  Troponin I  Result Value Ref Range   Troponin I <0.03 <0.03 ng/mL  Brain natriuretic peptide  Result Value Ref Range   B Natriuretic  Peptide 17.0 0.0 - 100.0 pg/mL   Dg Chest 2 View  Result Date: 02/02/2018 CLINICAL DATA:  Chest pain and tightness EXAM: CHEST - 2 VIEW COMPARISON:  None. FINDINGS: The heart size and mediastinal contours are within normal limits. Both lungs are clear. The visualized skeletal structures are unremarkable. IMPRESSION: No active cardiopulmonary disease. Electronically Signed   By: Inez Catalina M.D.   On: 02/02/2018 08:40   Ct Angio Neck W And/or Wo Contrast  Result Date: 02/02/2018 CLINICAL DATA:  Laughing episode yesterday with tightness in the chest. Question vertebral artery dissection. EXAM: CT ANGIOGRAPHY NECK TECHNIQUE: Multidetector CT imaging of the neck was performed using the standard protocol during bolus administration of intravenous contrast. Multiplanar CT image reconstructions and MIPs were obtained to evaluate the vascular anatomy. Carotid stenosis measurements (when applicable) are obtained utilizing NASCET criteria, using the distal internal carotid diameter as the denominator. CONTRAST:  43mL ISOVUE-370 IOPAMIDOL (ISOVUE-370) INJECTION 76% COMPARISON:  None. FINDINGS: Aortic arch: Mild ectasia of the aorta. No atherosclerotic change. 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 No origin stenosis. Right carotid system: Normal. No atherosclerotic change. No dissection. Wide patency through the skull base. Left carotid system: Normal. No atherosclerotic change. No dissection. Wide patency through the skull base. Vertebral arteries: Dominant right vertebral artery origin is widely patent. Right vertebral artery is widely patent through the cervical region, through the foramen magnum to the basilar. As noted above, the non dominant left vertebral artery takes origin from the aortic arch. Vessel is of uniform small caliber without evidence of atherosclerotic change or dissection. The vessel is  patent through the foramen magnum to the basilar. Skeleton: Ordinary mid cervical spondylosis. Other neck: No mass or lymphadenopathy. Upper chest: Normal IMPRESSION: Normal examination. No evidence of atherosclerotic disease or dissection. Electronically Signed   By: Nelson Chimes M.D.   On: 02/02/2018 10:24

## 2018-02-02 NOTE — Telephone Encounter (Signed)
He called in c/o having chest tightness since about 1:00PM yesterday that has been continuous.    See triage notes.      He is going to [facility].     I routed a note to Dr. Arnette Norris so she would be aware. Reason for Disposition . [1] Chest pain lasts > 5 minutes AND [2] age > 20 AND [3] at least one cardiac risk factor (i.e., hypertension, diabetes, obesity, smoker or strong family history of heart disease)    Father passed away with an aneurysm  Answer Assessment - Initial Assessment Questions 1. LOCATION: "Where does it hurt?"       Started yesterday after lunch about 1:00PM.     My chest started hurting while I was walking and talking with a Art therapist.   My respirations  are more rapid and labored.   I have to take a lot more breaks on my job.   My dad passed away with an aneurysm.    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 2. RADIATION: "Does the pain go anywhere else?" (e.g., into neck, jaw, arms, back)     If I bend over I feel pressure in my head and neck with my heart beat.   3. ONSET: "When did the chest pain begin?" (Minutes, hours or days)      1:00 PM yesterday.      4. PATTERN "Does the pain come and go, or has it been constant since it started?"  "Does it get worse with exertion?"      It's been there the whole time since yesterday.   The tightness gets worse when I do something or take a deep breath. 5. DURATION: "How long does it last" (e.g., seconds, minutes, hours)     Constant. 6. SEVERITY: "How bad is the pain?"  (e.g., Scale 1-10; mild, moderate, or severe)    - MILD (1-3): doesn't interfere with normal activities     - MODERATE (4-7): interferes with normal activities or awakens from sleep    - SEVERE (8-10): excruciating pain, unable to do any normal activities       I should not ignore it.     7. CARDIAC RISK FACTORS: "Do you have any history of heart problems or risk factors for heart disease?" (e.g., prior heart attack, angina; high blood  pressure, diabetes, being overweight, high cholesterol, smoking, or strong family history of heart disease)     My father passed away with an anursym.     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   I smoke pot occasionally.   No diabetes. 8. PULMONARY RISK FACTORS: "Do you have any history of lung disease?"  (e.g., blood clots in lung, asthma, emphysema, birth control pills)     No 9. CAUSE: "What do you think is causing the chest pain?"     I don't know 10. OTHER SYMPTOMS: "Do you have any other symptoms?" (e.g., dizziness, nausea, vomiting, sweating, fever, difficulty breathing, cough)       Lightheaded with exertion.   Labored breathing. 11. PREGNANCY: "Is there any chance you are pregnant?" "When was your last menstrual period?"       N/A  Protocols used: CHEST PAIN-A-AH

## 2018-02-02 NOTE — Telephone Encounter (Signed)
TA-FYI: Pt currently at ED/thx dmf

## 2018-02-02 NOTE — ED Notes (Signed)
First Nurse Note: Patient complaining of mid chest "tightness" since laughing at lunchtime yesterday.

## 2018-02-02 NOTE — ED Provider Notes (Signed)
[facility] Emergency Department Provider Note  ____________________________________________   First MD Initiated Contact with Patient 02/02/18 [phone]     (approximate)  I have reviewed the triage vital signs and the nursing notes.   HISTORY  Chief Complaint Chest Pain    HPI Ray Herrera is a 47 y.o. male who self presents the emergency department with sudden onset left chest "pressure" that began yesterday when he began laughing when a coworker told a joke.  The pain seemed to radiate up towards the left side of his neck.  He is currently in minimal discomfort.  He has had no numbness or weakness.  No headache.  No double vision or blurred vision.  No history of coronary artery disease.  No history of DVT or pulmonary embolism.  No recent surgery travel or immobilization.  No leg swelling.  He has had broken and bruised ribs in the past and he says this feels "different".  He is concerned because his father died of an aortic aneurysm and he is worried about his aorta.  The pain is not positional.  It is clearly worse when taking a deep breath and improved with rest.    Past Medical History:  Diagnosis Date  . Chronic headaches   . GERD (gastroesophageal reflux disease)   . Heart murmur   . Hypertension     Patient Active Problem List   Diagnosis Date Noted  . Visit for well man health check 10/27/2017  . Back pain 10/27/2017  . GERD (gastroesophageal reflux disease) 08/06/2011  . Hx of decompressive lumbar laminectomy 08/06/2011  . H/O repair of rotator cuff 08/06/2011    Past Surgical History:  Procedure Laterality Date  . distoclavicalectomy  2007   right  . MICRODISCECTOMY LUMBAR  2011   L5-S1    Prior to Admission medications   Medication Sig Start Date End Date Taking? Authorizing Provider  Esomeprazole Magnesium (NEXIUM PO) Take by mouth as needed.    [provider]  fluticasone (FLONASE) 50 MCG/ACT nasal spray PLACE 2  SPRAYS INTO BOTH NOSTRILS DAILY. 11/05/16   Lucille Passy, MD  methocarbamol (ROBAXIN) 750 MG tablet Take 1 tablet (750 mg total) by mouth 3 (three) times daily as needed for muscle spasms. 07/29/17   Elby Beck, FNP    Allergies Other  Family History  Problem Relation Age of Onset  . Diabetes Father   . Heart disease Father   . Hypertension Father   . Irritable bowel syndrome Father   . Diabetes Paternal Grandfather   . Heart disease Paternal Grandfather   . Lung cancer Maternal Grandfather   . Cancer Unknown        abdominal, maternal great aunt  . Crohn's disease Brother   . Colon cancer Neg Hx   . Kidney disease Neg Hx   . Prostate cancer Neg Hx     Social History Social History   Tobacco Use  . Smoking status: Never Smoker  . Smokeless tobacco: Never Used  Substance Use Topics  . Alcohol use: Yes    Alcohol/week: 0.0 oz    Comment: 2 shots weekly  . Drug use: No    Review of Systems Constitutional: No fever/chills Eyes: No visual changes. ENT: No sore throat. Cardiovascular: Positive for chest pain. Respiratory: Positive for shortness of breath. Gastrointestinal: No abdominal pain.  No nausea, no vomiting.  No diarrhea.  No constipation. Genitourinary: Negative for dysuria. Musculoskeletal: Negative for back pain. Skin: Negative for  rash. Neurological: Negative for headaches, focal weakness or numbness.   ____________________________________________   PHYSICAL EXAM:  VITAL SIGNS: ED Triage Vitals [02/02/18 0818]  Enc Vitals Group     BP (!) 141/87     Pulse Rate 77     Resp 20     Temp 98.7 F (37.1 C)     Temp Source Oral     SpO2 96 %     Weight 247 lb (112 kg)     Height 6' (1.829 m)     Head Circumference      Peak Flow      Pain Score 0     Pain Loc      Pain Edu?      Excl. in [place]?     Constitutional: Alert and oriented x4 pleasant cooperative appears somewhat anxious although no diaphoresis Eyes: PERRL EOMI. Head:  Atraumatic. Nose: No congestion/rhinnorhea. Mouth/Throat: No trismus Neck: No stridor.   Cardiovascular: Normal rate, regular rhythm. Grossly normal heart sounds.  Good peripheral circulation. Respiratory: Normal respiratory effort.  No retractions. Lungs CTAB and moving good air Gastrointestinal: Soft nontender Musculoskeletal: No lower extremity edema   Neurologic:  Normal speech and language. No gross focal neurologic deficits are appreciated. Skin:  Skin is warm, dry and intact. No rash noted.  Specifically no zoster Psychiatric: Mildly anxious appearing    ____________________________________________   DIFFERENTIAL includes but not limited to  Aortic dissection, vertebral artery or carotid artery dissection, acute coronary syndrome, pulmonary embolism, costochondritis ____________________________________________   LABS (all labs ordered are listed, but only abnormal results are displayed)  Labs Reviewed  CBC - Abnormal; Notable for the following components:      Result Value   RDW 15.1 (*)    All other components within normal limits  BASIC METABOLIC PANEL  TROPONIN I  BRAIN NATRIURETIC PEPTIDE    Lab work reviewed by me with no signs of acute ischemia __________________________________________  EKG  ED ECG REPORT I, Darel Hong, the attending physician, personally viewed and interpreted this ECG.  Date: 02/02/2018 EKG Time:  Rate: 78 Rhythm: normal sinus rhythm QRS Axis: normal Intervals: normal ST/T Wave abnormalities: normal Narrative Interpretation: no evidence of acute ischemia  ____________________________________________  RADIOLOGY   CT angiogram reviewed by me with no acute disease chest x-ray reviewed by me with no acute disease ____________________________________________   PROCEDURES  Procedure(s) performed: no  Procedures  Critical Care performed: no  ____________________________________________   INITIAL IMPRESSION /  ASSESSMENT AND PLAN / ED COURSE  Pertinent labs & imaging results that were available during my care of the patient were reviewed by me and considered in my medical decision making (see chart for details).   As part of my medical decision making, I reviewed the following data within the [place] History obtained from family if available, nursing notes, old chart and ekg, as well as notes from prior ED visits.  The patient arrives hemodynamically stable and overall relatively well-appearing with a nonischemic EKG.  He had sudden onset severe left chest pain that is radiating up his left neck.  He has no focal tenderness to suggest his separated rib or other chest wall etiology.  While he is neuro intact I do have some concern for aortic versus vertebral artery dissection.  After discussing this he told me his father died of an aortic aneurysm and that was his primary concern.  I offered IV opioid pain medication however he declined stating he only wants the  CT.  I ordered a CT Angie of his neck and spoke with the CT tech to drop the bottom of it down to the mid chest to make sure we get the aortic arch and the proximal descending aorta.     ----------------------------------------- 10:34 AM on 02/02/2018 -----------------------------------------  Fortunately the patient's CT scan is reassuring.  He is currently having no pain Arida we reviewed his imaging together with his wife and the patient understands there is still some diagnostic uncertainty but at this point with no ischemic changes on his EKG, normal troponin, and reassuring CT scan he is medically stable for outpatient management.  He verbalizes understanding and agree with the plan. ____________________________________________   FINAL CLINICAL IMPRESSION(S) / ED DIAGNOSES  Final diagnoses:  Atypical chest pain      NEW MEDICATIONS STARTED DURING THIS VISIT:  New Prescriptions   No medications on file      Note:  This document was prepared using Dragon voice recognition software and may include unintentional dictation errors.     Darel Hong, MD 02/02/18 1036

## 2018-02-15 ENCOUNTER — Ambulatory Visit (INDEPENDENT_AMBULATORY_CARE_PROVIDER_SITE_OTHER): Payer: BLUE CROSS/BLUE SHIELD | Admitting: Family Medicine

## 2018-02-15 ENCOUNTER — Encounter: Payer: Self-pay | Admitting: Family Medicine

## 2018-02-15 VITALS — BP 136/90 | HR 76 | Temp 98.7°F | Ht 72.0 in | Wt 253.0 lb

## 2018-02-15 DIAGNOSIS — Z23 Encounter for immunization: Secondary | ICD-10-CM | POA: Diagnosis not present

## 2018-02-15 DIAGNOSIS — R0609 Other forms of dyspnea: Secondary | ICD-10-CM | POA: Diagnosis not present

## 2018-02-15 DIAGNOSIS — R0600 Dyspnea, unspecified: Secondary | ICD-10-CM | POA: Insufficient documentation

## 2018-02-15 DIAGNOSIS — R079 Chest pain, unspecified: Secondary | ICD-10-CM | POA: Insufficient documentation

## 2018-02-15 NOTE — Patient Instructions (Signed)
Great to see you.  We will call you with an appointment to see a cardiologist.

## 2018-02-15 NOTE — Progress Notes (Signed)
Subjective:   Patient ID: Ray Herrera, male    DOB: June 01, 1971, 47 y.o.   MRN: [id number]  Ray Herrera is a pleasant 47 y.o. year old male who presents to clinic today with Hospitalization Follow-up (Patient is here today to be seen for a hospital F/U.  He was seen at [facility] on 8.619 for atypical chest pain.  He was C/O chest pressure that started on 8.5.19 and radiated up left side of his neck.  He was concerned as his father passed of an aortic aneurysm.  Sx worsened with deep breaths.  CT Angio, CXR, EKG, BNP, BMP, CBC, Troponin all WNL.  He states that he does not have the tightness but he seems to have an increase in SOB over the last 6 months.)  on 02/15/2018  HPI: ER follow up- notes reviewed.  Was seen at [facility] on 02/02/18 for CP.  The pain had started the day prior after he laughed at something.  Pain radiated up to to his left neck.  Symptoms worsened by deep breaths.  Work up in [place] was reassuring- CT angiogram, CXR, EKG, BNP, BMET, CBC, troponin neg.  He has had no more of the chest pain but has had some shortness of breath over the past 6 months.  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  Has noticed more DOE while working for at least the past 6 months, maybe even longer.  His father had a history heart disease.  Lab Results  Component Value Date   CHOL 161 10/27/2017   HDL 57.20 10/27/2017   LDLCALC 88 10/27/2017   TRIG 79.0 10/27/2017   CHOLHDL 3 10/27/2017   Dg Chest 2 View  Result Date: 02/02/2018 CLINICAL DATA:  Chest pain and tightness EXAM: CHEST - 2 VIEW COMPARISON:  None. FINDINGS: The heart size and mediastinal contours are within normal limits. Both lungs are clear. The visualized skeletal structures are unremarkable. IMPRESSION: No active cardiopulmonary disease. Electronically Signed   By: Inez Catalina M.D.   On: 02/02/2018 08:40   Ct Angio Neck W And/or Wo Contrast  Result Date: 02/02/2018 CLINICAL DATA:  Laughing episode yesterday with  tightness in the chest. Question vertebral artery dissection. EXAM: CT ANGIOGRAPHY NECK TECHNIQUE: Multidetector CT imaging of the neck was performed using the standard protocol during bolus administration of intravenous contrast. Multiplanar CT image reconstructions and MIPs were obtained to evaluate the vascular anatomy. Carotid stenosis measurements (when applicable) are obtained utilizing NASCET criteria, using the distal internal carotid diameter as the denominator. CONTRAST:  66mL ISOVUE-370 IOPAMIDOL (ISOVUE-370) INJECTION 76% COMPARISON:  None. FINDINGS: Aortic arch: Mild ectasia of the aorta. No atherosclerotic change. Branching pattern of the brachiocephalic vessels is normal, with the congenital variation of the left vertebral artery arising directly from the arch. No origin stenosis. Right carotid system: Normal. No atherosclerotic change. No dissection. Wide patency through the skull base. Left carotid system: Normal. No atherosclerotic change. No dissection. Wide patency through the skull base. Vertebral arteries: Dominant right vertebral artery origin is widely patent. Right vertebral artery is widely patent through the cervical region, through the foramen magnum to the basilar. As noted above, the non dominant left vertebral artery takes origin from the aortic arch. Vessel is of uniform small caliber without evidence of atherosclerotic change or dissection. The vessel is patent through the foramen magnum to the basilar. Skeleton: Ordinary mid cervical spondylosis. Other neck: No mass or lymphadenopathy. Upper chest: Normal IMPRESSION: Normal examination. No evidence of  atherosclerotic disease or dissection. Electronically Signed   By: Nelson Chimes M.D.   On: 02/02/2018 10:24    Current Outpatient Medications on File Prior to Visit  Medication Sig Dispense Refill  . Esomeprazole Magnesium (NEXIUM PO) Take by mouth as needed.    . fluticasone (FLONASE) 50 MCG/ACT nasal spray PLACE 2 SPRAYS INTO  BOTH NOSTRILS DAILY. 16 g 2  . methocarbamol (ROBAXIN) 750 MG tablet Take 1 tablet (750 mg total) by mouth 3 (three) times daily as needed for muscle spasms. 30 tablet 0   No current facility-administered medications on file prior to visit.     Allergies  Allergen Reactions  . Other Itching    [place] Nuts    Past Medical History:  Diagnosis Date  . Chronic headaches   . GERD (gastroesophageal reflux disease)   . Heart murmur   . Hypertension     Past Surgical History:  Procedure Laterality Date  . distoclavicalectomy  2007   right  . MICRODISCECTOMY LUMBAR  2011   L5-S1    Family History  Problem Relation Age of Onset  . Diabetes Father   . Heart disease Father   . Hypertension Father   . Irritable bowel syndrome Father   . Diabetes Paternal Grandfather   . Heart disease Paternal Grandfather   . Lung cancer Maternal Grandfather   . Cancer Unknown        abdominal, maternal great aunt  . Crohn's disease Brother   . Colon cancer Neg Hx   . Kidney disease Neg Hx   . Prostate cancer Neg Hx     Social History   Socioeconomic History  . Marital status: Married    Spouse name: Not on file  . Number of children: 1  . Years of education: Not on file  . Highest education level: Not on file  Occupational History    Employer: [employer]  Social Needs  . Financial resource strain: Not on file  . Food insecurity:    Worry: Not on file    Inability: Not on file  . Transportation needs:    Medical: Not on file    Non-medical: Not on file  Tobacco Use  . Smoking status: Never Smoker  . Smokeless tobacco: Never Used  Substance and Sexual Activity  . Alcohol use: Yes    Alcohol/week: 0.0 standard drinks    Comment: 2 shots weekly  . Drug use: No  . Sexual activity: Not on file  Lifestyle  . Physical activity:    Days per week: Not on file    Minutes per session: Not on file  . Stress: Not on file  Relationships  . Social connections:    Talks on phone:  Not on file    Gets together: Not on file    Attends religious service: Not on file    Active member of club or organization: Not on file    Attends meetings of clubs or organizations: Not on file    Relationship status: Not on file  . Intimate partner violence:    Fear of current or ex partner: Not on file    Emotionally abused: Not on file    Physically abused: Not on file    Forced sexual activity: Not on file  Other Topics Concern  . Not on file  Social History Narrative   Caffeine  Daily   Married to [employer]   Does use anabolic steroids- lifts weight   Works as Radiation protection practitioner  The PMH, PSH, Social History, Family History, Medications, and allergies have been reviewed in [facility], and have been updated if relevant.   Review of Systems  Constitutional: Negative.   HENT: Negative.   Eyes: Negative.   Respiratory: Positive for shortness of breath. Negative for chest tightness.   Cardiovascular: Negative for chest pain and leg swelling.  Gastrointestinal: Negative.   Endocrine: Negative.   Genitourinary: Negative.   Musculoskeletal: Negative.   Allergic/Immunologic: Negative.   Neurological: Negative.   Hematological: Negative.   Psychiatric/Behavioral: Negative.   All other systems reviewed and are negative.      Objective:    BP 136/90 (BP Location: Left Arm, Cuff Size: Normal)   Pulse 76   Temp 98.7 F (37.1 C) (Oral)   Ht 6' (1.829 m)   Wt 253 lb (114.8 kg)   SpO2 97%   BMI 34.31 kg/m    Physical Exam  Constitutional: He is oriented to person, place, and time. He appears well-developed and well-nourished. No distress.  HENT:  Head: Normocephalic and atraumatic.  Eyes: EOM are normal.  Cardiovascular: Normal rate and regular rhythm.  Pulmonary/Chest: Effort normal and breath sounds normal.  Musculoskeletal: Normal range of motion. He exhibits no edema.  Neurological: He is alert and oriented to person, place, and time. No cranial nerve  deficit. Coordination normal.  Skin: Skin is warm and dry. He is not diaphoretic.  Psychiatric: He has a normal mood and affect. His behavior is normal. Judgment and thought content normal.  Nursing note and vitals reviewed.         Assessment & Plan:   Chest pain, unspecified type - Plan: Ambulatory referral to Cardiology, CANCELED: Ambulatory referral to Cardiology  Need for Tdap vaccination - Plan: Tdap vaccine greater than or equal to 7yo IM  DOE (dyspnea on exertion) - Plan: Ambulatory referral to Cardiology, CANCELED: Ambulatory referral to Cardiology No follow-ups on file.

## 2018-02-15 NOTE — Assessment & Plan Note (Signed)
In setting of recent chest pain which was possible worsened by exertion now that I talk more to [place] about it.  While work up in ED was reassuring, he still needs a cardiac evaluation- ie stress test,etc. The patient indicates understanding of these issues and agrees with the plan.  Referral placed.

## 2018-02-15 NOTE — Assessment & Plan Note (Signed)
Resolved but has been associated with progressive DOE. Refer to cardiology for further work up. The patient indicates understanding of these issues and agrees with the plan.

## 2018-02-16 NOTE — Progress Notes (Signed)
Cardiology Office Note  Date:  02/17/2018   ID:  YUVIN BUSSIERE, DOB 1971-02-26, MRN [id number]  PCP:  Lucille Passy, MD   Chief Complaint  Patient presents with  . New Patient (Initial Visit)    Referred by PCP for Chest pain and SOB in exertion. Patient states these symptoms have subsided. Patient was recently in the ED 02/02/2018. Meds reviewed verbally with patient.     HPI:  Ray Herrera is a 47 y.o. male with past medical history of Gerd  obesity Who presents by referral from Dr. Deborra Medina for consultation of his chest pain symptoms  He reports that he was having lunch with friend at work Following lunch was walking when he started laughing (hard) Developed some pain in his chest radiating up to his neck Took some aspirin Symptoms persisted overnight and into the next day In the morning took more aspirin Worsening pain with bending forward moving his upper body Pain also worse with deep inspiration and coughing Did not feel that his symptoms were musculoskeletal  In the emergency room had neck and aortic arch CT scan Which was normal No plaque noted  Since then has felt fine with no reproducible chest pain on exertion Has not had symptoms like this in the past He is concerned about hiatal hernia or other etiology  EKG personally reviewed by myself on todays visit Shows normal sinus rhythm rate 69 minute no significant ST or T-wave changes   PMH:   has a past medical history of Chronic headaches, GERD (gastroesophageal reflux disease), Heart murmur, and Hypertension.  PSH:    Past Surgical History:  Procedure Laterality Date  . distoclavicalectomy  2007   right  . MICRODISCECTOMY LUMBAR  2011   L5-S1    Current Outpatient Medications  Medication Sig Dispense Refill  . Esomeprazole Magnesium (NEXIUM PO) Take by mouth as needed.    . fluticasone (FLONASE) 50 MCG/ACT nasal spray PLACE 2 SPRAYS INTO BOTH NOSTRILS DAILY. 16 g 2  . methocarbamol (ROBAXIN) 750 MG  tablet Take 1 tablet (750 mg total) by mouth 3 (three) times daily as needed for muscle spasms. 30 tablet 0   No current facility-administered medications for this visit.      Allergies:   Other   Social History:  The patient  reports that he has never smoked. He has never used smokeless tobacco. He reports that he drinks alcohol. He reports that he does not use drugs.   Family History:   family history includes Cancer in his unknown relative; Crohn's disease in his brother; Diabetes in his father and paternal grandfather; Heart disease in his father and paternal grandfather; Hypertension in his father; Irritable bowel syndrome in his father; Lung cancer in his maternal grandfather.    Review of Systems: Review of Systems  Constitutional: Negative.   Respiratory: Negative.   Cardiovascular: Positive for chest pain.  Gastrointestinal: Negative.   Musculoskeletal: Negative.   Neurological: Negative.   Psychiatric/Behavioral: Negative.   All other systems reviewed and are negative.    PHYSICAL EXAM: VS:  BP (!) 140/98 (BP Location: Right Arm, Patient Position: Sitting, Cuff Size: Normal)   Pulse 69   Ht 6' (1.829 m)   Wt 251 lb (113.9 kg)   BMI 34.04 kg/m  , BMI Body mass index is 34.04 kg/m. GEN: Well nourished, well developed, in no acute distress  HEENT: normal  Neck: no JVD, carotid bruits, or masses Cardiac: RRR; no murmurs, rubs, or gallops,no edema  Respiratory:  clear to auscultation bilaterally, normal work of breathing GI: soft, nontender, nondistended, + BS MS: no deformity or atrophy  Skin: warm and dry, no rash Neuro:  Strength and sensation are intact Psych: euthymic mood, full affect    Recent Labs: 10/27/2017: ALT 18 02/02/2018: B Natriuretic Peptide 17.0; BUN 17; Creatinine, Ser 1.09; Hemoglobin 14.9; Platelets 191; Potassium 3.8; Sodium 140    Lipid Panel Lab Results  Component Value Date   CHOL 161 10/27/2017   HDL 57.20 10/27/2017   LDLCALC 88  10/27/2017   TRIG 79.0 10/27/2017      Wt Readings from Last 3 Encounters:  02/17/18 251 lb (113.9 kg)  02/15/18 253 lb (114.8 kg)  02/02/18 247 lb (112 kg)       ASSESSMENT AND PLAN:  Chest pain, unspecified type - Plan: EKG 12-Lead Atypical in nature Was worse with coughing deep inspiration, bending Less likely cardiac in etiology Unable to exclude GI source such as hiatal hernia as he just had a large meal and was then laughing hard He does not feel is musculoskeletal Given no further symptoms, low risk factor profile nonsmoker nondiabetic with low cholesterol, CT scan with no atherosclerosis of carotids or aorta No further testing has been ordered Recommended he call us for any further symptoms For typical symptoms CT coronary calcium score could be ordered  DOE (dyspnea on exertion) - Plan: EKG 12-Lead We have encouraged continued exercise, careful diet management in an effort to lose weight.  Gastroesophageal reflux disease without esophagitis Takes proton pump inhibitor with improvement of his symptoms  Disposition:   F/U  As needed   Total encounter time more than 60 minutes  Greater than 50% was spent in counseling and coordination of care with the patient  Patient seen in consultation for Dr. Deborra Medina and will be referred back to her office for ongoing care of the issues detailed above   Orders Placed This Encounter  Procedures  . EKG 12-Lead     Signed, Esmond Plants, M.D., Ph.D. 02/17/2018  [place], [place]

## 2018-02-17 ENCOUNTER — Ambulatory Visit (INDEPENDENT_AMBULATORY_CARE_PROVIDER_SITE_OTHER): Payer: BLUE CROSS/BLUE SHIELD | Admitting: Cardiovascular Disease

## 2018-02-17 ENCOUNTER — Encounter: Payer: Self-pay | Admitting: Cardiovascular Disease

## 2018-02-17 VITALS — BP 140/98 | HR 69 | Ht 72.0 in | Wt 251.0 lb

## 2018-02-17 DIAGNOSIS — K219 Gastro-esophageal reflux disease without esophagitis: Secondary | ICD-10-CM | POA: Diagnosis not present

## 2018-02-17 DIAGNOSIS — R0600 Dyspnea, unspecified: Secondary | ICD-10-CM

## 2018-02-17 DIAGNOSIS — R0609 Other forms of dyspnea: Secondary | ICD-10-CM

## 2018-02-17 DIAGNOSIS — R079 Chest pain, unspecified: Secondary | ICD-10-CM | POA: Diagnosis not present

## 2018-02-17 NOTE — Patient Instructions (Signed)
Monitor blood pressure Goal diastolic pressure <52   Medication Instructions:   No medication changes made  Labwork:  No new labs needed  Testing/Procedures:  No further testing at this time  Call if you would like a CT coronary calcium score [place] $150   Follow-Up: It was a pleasure seeing you in the office today. Please call us if you have new issues that need to be addressed before your next appt.  [phone]  Your physician wants you to follow-up in:  As needed  If you need a refill on your cardiac medications before your next appointment, please call your pharmacy.  For educational health videos Log in to : [email or web address] Or : [email or web address], password : [email or web address]

## 2018-03-25 ENCOUNTER — Encounter: Payer: Self-pay | Admitting: Family Medicine

## 2018-06-16 ENCOUNTER — Encounter: Payer: Self-pay | Admitting: Gastroenterology

## 2018-07-04 ENCOUNTER — Encounter: Payer: Self-pay | Admitting: Gastroenterology

## 2018-08-29 DIAGNOSIS — R030 Elevated blood-pressure reading, without diagnosis of hypertension: Secondary | ICD-10-CM | POA: Diagnosis not present

## 2018-08-29 DIAGNOSIS — J0141 Acute recurrent pansinusitis: Secondary | ICD-10-CM | POA: Diagnosis not present

## 2018-09-29 ENCOUNTER — Telehealth: Payer: Self-pay

## 2018-09-29 NOTE — Telephone Encounter (Signed)
Copied from [place] [id number]. Topic: General - Other >> Sep 29, 2018  7:18 AM Carolyn Stare wrote:  Right knee pain

## 2018-10-01 NOTE — Telephone Encounter (Signed)
Called Pt . APPT set for 10/04/2018 W/Dr. Raeford Razor. Taks completed . JAP

## 2018-10-01 NOTE — Telephone Encounter (Signed)
JP-Can you plz call pt and see about getting him scheduled as a Webex with Dr. Deborra Medina or an appt with Dr. Raeford Razor? I sent him a MyChart message without response/plz advise/thx dmf

## 2018-10-04 ENCOUNTER — Ambulatory Visit (INDEPENDENT_AMBULATORY_CARE_PROVIDER_SITE_OTHER): Payer: BLUE CROSS/BLUE SHIELD | Admitting: Family Medicine

## 2018-10-04 DIAGNOSIS — M25561 Pain in right knee: Secondary | ICD-10-CM | POA: Insufficient documentation

## 2018-10-04 MED ORDER — DICLOFENAC SODIUM 2 % TD SOLN: 1.0000 "application " | Freq: Two times a day (BID) | TRANSDERMAL | 3 refills | Status: DC

## 2018-10-04 NOTE — Assessment & Plan Note (Signed)
Pain seems to be likely related to either degenerative meniscus or arthritis.  May have component of patellofemoral syndrome contributing as well. - pennsaid  - counseled on HEP and supportive care - if no improvement consider imaging

## 2018-10-04 NOTE — Progress Notes (Signed)
Virtual Visit via Video Note  I connected with Ray Herrera on 10/04/18 at 10:50 AM EDT by a video enabled telemedicine application and verified that I am speaking with the correct person using two identifiers.   I discussed the limitations of evaluation and management by telemedicine and the availability of in person appointments. The patient expressed understanding and agreed to proceed.  History of Present Illness:  He is reporting 2 to 3 weeks of medial and lateral joint line pain.  It is a soreness in nature.  It is intermittent.  He has been active throughout his whole life playing different sports.  Denies any inciting event.  Has limitations on the amount of anti-inflammatories he can take.  Denies any mechanical symptoms.  Feels like his symptoms are staying the same.  Does have some pain going up and down stairs.  Has a pain with squats.  Pain is moderate.  Feels like it is currently staying the same.   Observations/Objective:  Gen: NAD, alert, cooperative with exam, well-appearing ENT: normal lips, normal nasal mucosa,  Eye: normal EOM, normal conjunctiva and lids CV:  no edema,   Resp: no accessory muscle use, non-labored,  Skin: no rashes,  Neuro: normal tone,  Psych:  normal insight, alert and oriented MSK:  Right knee: Mild effusion. Normal range of motion. Normal gait   Assessment and Plan:  Pain seems to be likely related to either degenerative meniscus or arthritis.  May have component of patellofemoral syndrome contributing as well. - pennsaid  - counseled on HEP and supportive care - if no improvement consider imaging   Follow Up Instructions:    I discussed the assessment and treatment plan with the patient. The patient was provided an opportunity to ask questions and all were answered. The patient agreed with the plan and demonstrated an understanding of the instructions.   The patient was advised to call back or seek an in-person evaluation if the  symptoms worsen or if the condition fails to improve as anticipated.    Clearance Coots, MD

## 2018-10-05 ENCOUNTER — Other Ambulatory Visit: Payer: Self-pay

## 2018-10-05 ENCOUNTER — Encounter: Payer: Self-pay | Admitting: Family Medicine

## 2018-10-05 ENCOUNTER — Telehealth: Payer: Self-pay | Admitting: Family Medicine

## 2018-10-05 DIAGNOSIS — M25561 Pain in right knee: Secondary | ICD-10-CM

## 2018-10-05 MED ORDER — DICLOFENAC SODIUM 2 % TD SOLN: 1.0000 "application " | Freq: Two times a day (BID) | TRANSDERMAL | 3 refills | Status: DC

## 2018-10-05 NOTE — Telephone Encounter (Signed)
Copied from [place] [id number]. Topic: General - Inquiry >> Oct 05, 2018 10:42 AM Richardo Priest, NT wrote: Reason for CRM: Patient called in stating that he was seen by Dr.Schmitz yesterday and was prescribed Diclofenac Sodium (PENNSAID) 2 % SOLN. Patient states it was sent to the wrong pharmacy and would like for it to be sent to [id number] - [place], [place] (Phone) [phone] (Fax)

## 2018-10-05 NOTE — Telephone Encounter (Signed)
Pt aware that this medication needs to be sent to [place] first and cannot go to his pharmacy.  Will resend this medication in and pt aware that he needs to speak with the [place] pharmacist.

## 2020-01-16 DIAGNOSIS — M25562 Pain in left knee: Secondary | ICD-10-CM | POA: Diagnosis not present

## 2020-01-18 DIAGNOSIS — M25562 Pain in left knee: Secondary | ICD-10-CM | POA: Diagnosis not present

## 2020-01-20 DIAGNOSIS — M25562 Pain in left knee: Secondary | ICD-10-CM | POA: Diagnosis not present

## 2020-01-26 DIAGNOSIS — M1712 Unilateral primary osteoarthritis, left knee: Secondary | ICD-10-CM | POA: Diagnosis not present

## 2020-01-26 DIAGNOSIS — G8918 Other acute postprocedural pain: Secondary | ICD-10-CM | POA: Diagnosis not present

## 2020-01-26 DIAGNOSIS — M94262 Chondromalacia, left knee: Secondary | ICD-10-CM | POA: Diagnosis not present

## 2020-01-26 DIAGNOSIS — M2342 Loose body in knee, left knee: Secondary | ICD-10-CM | POA: Diagnosis not present

## 2020-01-26 DIAGNOSIS — M13162 Monoarthritis, not elsewhere classified, left knee: Secondary | ICD-10-CM | POA: Diagnosis not present

## 2020-02-01 DIAGNOSIS — M13162 Monoarthritis, not elsewhere classified, left knee: Secondary | ICD-10-CM | POA: Diagnosis not present

## 2020-03-04 IMAGING — CT CT ANGIO NECK
3 of 7 series · 10 of 36 positions shown · IV contrast (APPLIED)
Comparison: None.

CLINICAL DATA: Laughing episode yesterday with tightness in the
chest. Question vertebral artery dissection.

EXAM:
CT ANGIOGRAPHY NECK
TECHNIQUE: Multidetector CT imaging of the neck was performed using the
standard protocol during bolus administration of intravenous
contrast. Multiplanar CT image reconstructions and MIPs were
obtained to evaluate the vascular anatomy. Carotid stenosis
measurements (when applicable) are obtained utilizing NASCET
criteria, using the distal internal carotid diameter as the
denominator.
CONTRAST:  75mL UE0A5W-63P IOPAMIDOL (UE0A5W-63P) INJECTION 76%

[Series 4: cta neck · axial · 0.49mm/px · z∈[-258,-162]mm · 2 of 144 slices shown]
[im 48/144  soft-tissue]
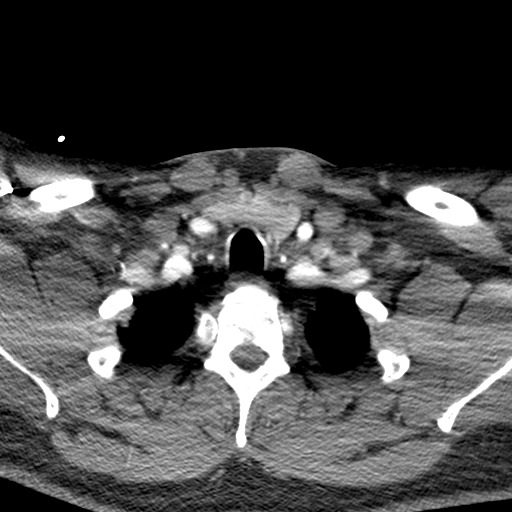
[im 96/144  soft-tissue]
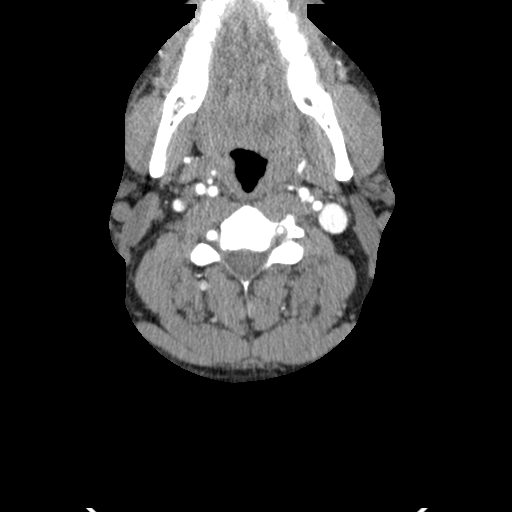

[Series 6: ax thin · axial · 0.50mm/px · z∈[-302,-95]mm · 6 of 292 slices shown]
[im 42/292  soft-tissue]
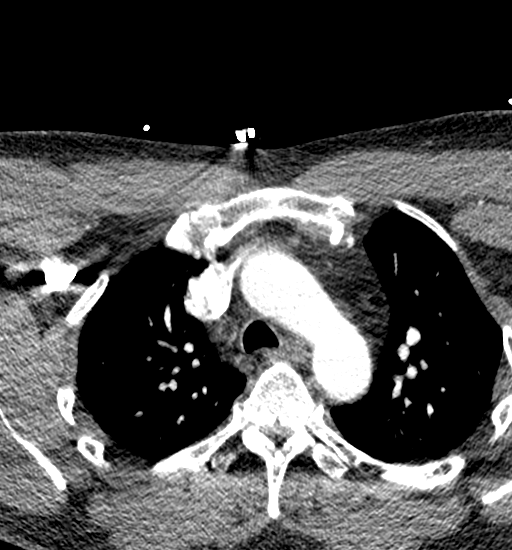
[im 84/292  bone]
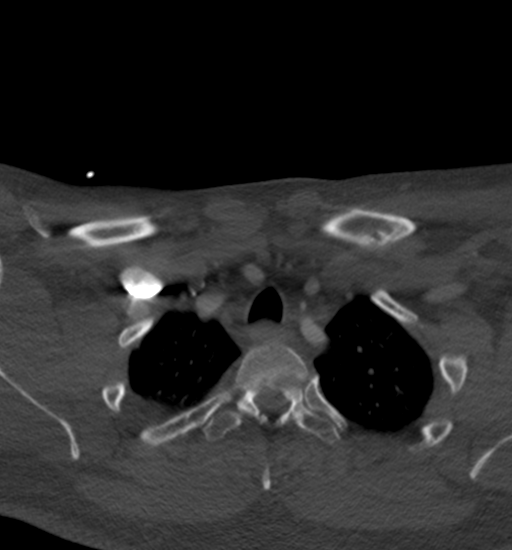
[im 125/292  soft-tissue]
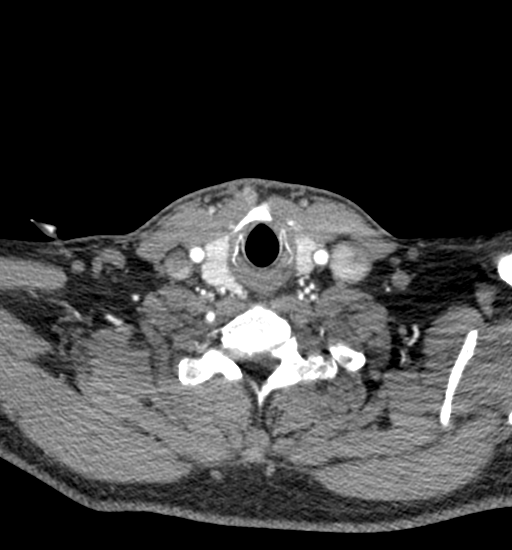
[im 167/292  bone]
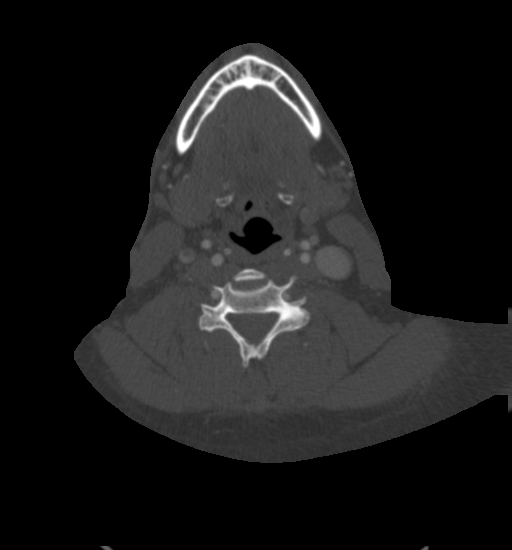
[im 208/292  soft-tissue]
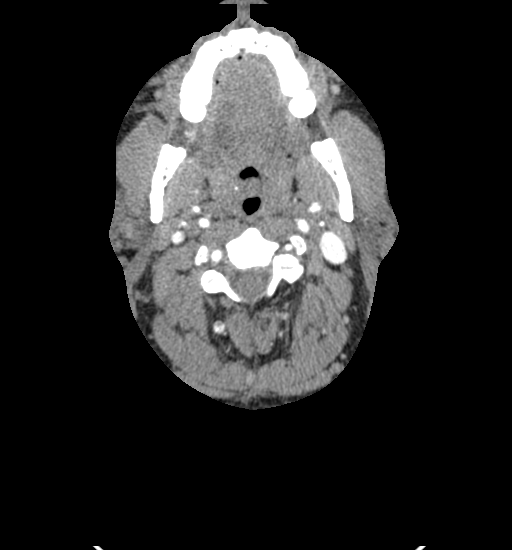
[im 250/292  bone]
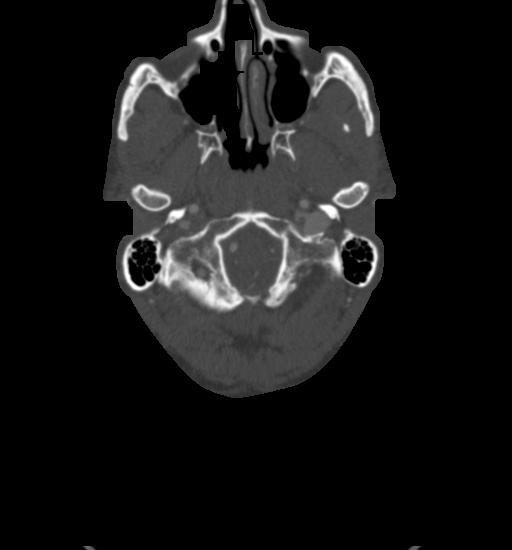

[Series 8: sagittal thin · sagittal · 0.53mm/px · 2 of 255 slices shown]
[im 66/255  soft-tissue]
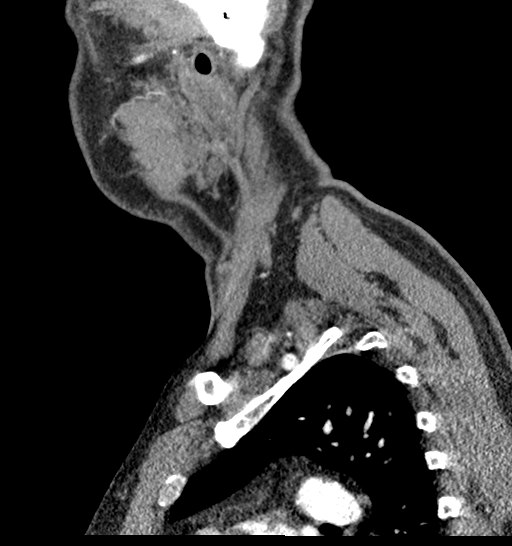
[im 190/255  soft-tissue]
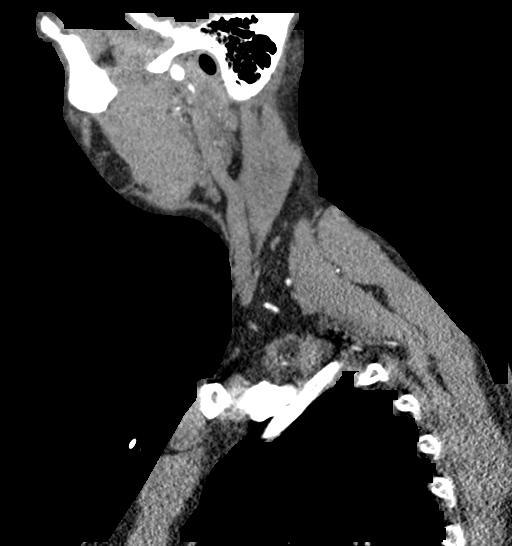

[10 of 36 positions shown; findings below may reference images not displayed]

FINDINGS: Aortic arch: Mild ectasia of the aorta. No atherosclerotic change.
Branching pattern of the brachiocephalic vessels is normal, with the
congenital variation of the left vertebral artery arising directly
from the arch. No origin stenosis.

Right carotid system: Normal. No atherosclerotic change. No
dissection. Wide patency through the skull base.

Left carotid system: Normal. No atherosclerotic change. No
dissection. Wide patency through the skull base.

Vertebral arteries: Dominant right vertebral artery origin is widely
patent. Right vertebral artery is widely patent through the cervical
region, through the foramen magnum to the basilar. As noted above,
the non dominant left vertebral artery takes origin from the aortic
arch. Vessel is of uniform small caliber without evidence of
atherosclerotic change or dissection. The vessel is patent through
the foramen magnum to the basilar.

Skeleton: Ordinary mid cervical spondylosis.

Other neck: No mass or lymphadenopathy.

Upper chest: Normal
IMPRESSION: Normal examination. No evidence of atherosclerotic disease or
dissection.

## 2021-01-01 ENCOUNTER — Emergency Department (HOSPITAL_BASED_OUTPATIENT_CLINIC_OR_DEPARTMENT_OTHER)
Admission: EM | Admit: 2021-01-01 | Discharge: 2021-01-01 | Disposition: A | Discharge status: Home / Self Care | Payer: BC Managed Care – PPO | Attending: Emergency Medicine | Admitting: Emergency Medicine

## 2021-01-01 ENCOUNTER — Other Ambulatory Visit: Payer: Self-pay

## 2021-01-01 ENCOUNTER — Encounter (HOSPITAL_BASED_OUTPATIENT_CLINIC_OR_DEPARTMENT_OTHER): Payer: Self-pay

## 2021-01-01 ENCOUNTER — Emergency Department (HOSPITAL_BASED_OUTPATIENT_CLINIC_OR_DEPARTMENT_OTHER): Payer: BC Managed Care – PPO

## 2021-01-01 DIAGNOSIS — B349 Viral infection, unspecified: Secondary | ICD-10-CM | POA: Insufficient documentation

## 2021-01-01 DIAGNOSIS — I10 Essential (primary) hypertension: Secondary | ICD-10-CM | POA: Insufficient documentation

## 2021-01-01 DIAGNOSIS — Z20822 Contact with and (suspected) exposure to covid-19: Secondary | ICD-10-CM | POA: Insufficient documentation

## 2021-01-01 DIAGNOSIS — R509 Fever, unspecified: Secondary | ICD-10-CM | POA: Diagnosis present

## 2021-01-01 LAB — BASIC METABOLIC PANEL
Anion gap: 9 (ref 5–15)
BUN: 14 mg/dL (ref 6–20)
CO2: 25 mmol/L (ref 22–32)
Calcium: 9.2 mg/dL (ref 8.9–10.3)
Chloride: 101 mmol/L (ref 98–111)
Creatinine, Ser: 0.98 mg/dL (ref 0.61–1.24)
GFR, Estimated: >60 mL/min (ref >60)
Glucose, Bld: 95 mg/dL (ref 70–99)
Potassium: 3.7 mmol/L (ref 3.5–5.1)
Sodium: 135 mmol/L (ref 135–145)

## 2021-01-01 LAB — CBC WITH DIFFERENTIAL/PLATELET
Abs Immature Granulocytes: 0.02 10*3/uL (ref 0.00–0.07)
Basophils Absolute: 0 10*3/uL (ref 0.0–0.1)
Basophils Relative: 0 %
Eosinophils Absolute: 0 10*3/uL (ref 0.0–0.5)
Eosinophils Relative: 0 %
HCT: 44.1 % (ref 39.0–52.0)
Hemoglobin: 14.7 g/dL (ref 13.0–17.0)
Immature Granulocytes: 0 %
Lymphocytes Relative: 22 %
Lymphs Abs: 1.4 10*3/uL (ref 0.7–4.0)
MCH: 27.3 pg (ref 26.0–34.0)
MCHC: 33.3 g/dL (ref 30.0–36.0)
MCV: 82 fL (ref 80.0–100.0)
Monocytes Absolute: 1 10*3/uL (ref 0.1–1.0)
Monocytes Relative: 15 %
Neutro Abs: 4 10*3/uL (ref 1.7–7.7)
Neutrophils Relative %: 63 %
Platelets: 165 10*3/uL (ref 150–400)
RBC: 5.38 MIL/uL (ref 4.22–5.81)
RDW: 13.5 % (ref 11.5–15.5)
WBC: 6.4 10*3/uL (ref 4.0–10.5)
nRBC: 0 % (ref 0.0–0.2)

## 2021-01-01 LAB — RESP PANEL BY RT-PCR (FLU A&B, COVID) ARPGX2
Influenza A by PCR: NEGATIVE
Influenza B by PCR: NEGATIVE
SARS Coronavirus 2 by RT PCR: NEGATIVE

## 2021-01-01 MED ORDER — BENZONATATE 100 MG PO CAPS: 100.0000 mg | ORAL_CAPSULE | Freq: Three times a day (TID) | ORAL | 0 refills | Status: DC | PRN

## 2021-01-01 NOTE — ED Notes (Signed)
Very alert and oriented, skin warm and dry, capillary refill wnl. Pt states he noted an embedded tick on rt side ant chest. Took Ibuprofen "approx 1000mg " per pt statement approx 0730hrs today. Appears in no distress at this time.

## 2021-01-01 NOTE — Discharge Instructions (Addendum)
You came to the emergency department to be evaluated for your fever, fatigue, and cough.  You were negative for COVID-19, your chest x-ray showed no signs of infection, your lab work was unremarkable.  Your symptoms are likely due to a viral upper respiratory illness and should improve over time.  I have given you prescription for Tessalon.  You may take this medicine once every 8 hours to help with your cough.  If your symptoms do not improve in the next 7 days please follow-up with your primary care provider or urgent care for further evaluation.  You are considered infectious until you are fever free for 24 hours.  You can alternate Tylenol/acetaminophen and Advil/ibuprofen/Motrin every 4 hours for sore throat, body aches, headache or fever.  Do not take more than 3,000 mg tylenol in a 24 hour period.  Please check all medication labels as many medications such as pain and cold medications may contain tylenol.  Do not drink alcohol while taking these medications.  Do not take other NSAID'S while taking ibuprofen (such as aleve or naproxen).  Please take ibuprofen with food to decrease stomach upset.  Please make sure to stay well hydrated.  Drink plenty of water or watered down sports drinks.  If drinking sports drinks please stay away from red colored drinks; as they can cause confusion for bleeding if you vomit.   Please make sure to practice good hand hygiene to help prevent the spread of flu.  You may use saline nasal spray for congestion.  Can use over-the-counter cough medication to help with your cough.  Follow up with your primary care provider if symptoms persist.    Return to the ER for inability to swallow liquids, difficulty breathing, or new or worsening symptoms.    Get help right away if: You have shortness of breath that gets worse. You have severe or persistent: Headache. Ear pain. Sinus pain. Chest pain. You have chronic lung disease along with any of the  following: Wheezing. Prolonged cough. Coughing up blood. A change in your usual mucus. You have a stiff neck. You have changes in your: Vision. Hearing. Thinking. Mood.

## 2021-01-01 NOTE — ED Provider Notes (Addendum)
[facility] EMERGENCY DEPARTMENT Provider Note   CSN: [id number] Arrival date & time: 01/01/21  1145     History Chief Complaint  Patient presents with   Fever    Ray Herrera is a 50 y.o. male with history hypertension, GERD, and chronic headaches.  Patient presents emergency department with a chief complaint of fever, cough, fatigue, and intermittent headaches over the last week.  Patient reports that headaches are gradual in onset and progressively worsened over time.  Patient has relief of headaches when taking ibuprofen.  Patient denies any associated facial asymmetry, slurred speech, numbness, weakness, visual disturbance.  Patient reports that T-max in the last 24 hours of fever was 100.3.  He has been taking ibuprofen to control his fevers.  Patient reports that cough is nonproductive.  He has been generalized.  Patient reports that he was recently exposed to his wife who was sick with similar symptoms.  Patient also went to [place] prior to his symptoms starting.  Patient also endorses shortness of breath and pain with deep inhalation.  Has been vaccinated for COVID-19, no COVID-19 booster.  Patient did not receive vaccination for influenza.  No unilateral leg swelling/tenderness, surgery/trauma in the last 12 weeks, prior DVT, hormone use.  Patient reports that 2 months prior he had a tick on his abdomen for less than 24 hours.  Patient did not develop any bull's-eye or generalized rash after this exposure.   Fever Associated symptoms: cough, headaches and sore throat   Associated symptoms: no chest pain, no chills, no confusion, no congestion, no diarrhea, no dysuria, no nausea, no rash, no rhinorrhea and no vomiting       Past Medical History:  Diagnosis Date   Chronic headaches    GERD (gastroesophageal reflux disease)    Heart murmur    Hypertension     Patient Active Problem List   Diagnosis Date Noted   Acute pain of right knee 10/04/2018    Chest pain 02/15/2018   DOE (dyspnea on exertion) 02/15/2018   GERD (gastroesophageal reflux disease) 08/06/2011   Hx of decompressive lumbar laminectomy 08/06/2011   H/O repair of rotator cuff 08/06/2011    Past Surgical History:  Procedure Laterality Date   distoclavicalectomy  2007   right   MICRODISCECTOMY LUMBAR  2011   L5-S1       Family History  Problem Relation Age of Onset   Diabetes Father    Heart disease Father    Hypertension Father    Irritable bowel syndrome Father    Diabetes Paternal Grandfather    Heart disease Paternal Grandfather    Lung cancer Maternal Grandfather    Cancer Other        abdominal, maternal great aunt   Crohn's disease Brother    Colon cancer Neg Hx    Kidney disease Neg Hx    Prostate cancer Neg Hx     Social History   Tobacco Use   Smoking status: Never   Smokeless tobacco: Never  Substance Use Topics   Alcohol use: Yes    Alcohol/week: 0.0 standard drinks    Comment: 2 shots weekly   Drug use: No    Home Medications Prior to Admission medications   Medication Sig Start Date End Date Taking? Authorizing Provider  Diclofenac Sodium (PENNSAID) 2 % SOLN Place 1 application onto the skin 2 (two) times daily. 10/05/18   Rosemarie Ax, MD  Esomeprazole Magnesium (NEXIUM PO) Take by mouth as needed.  [provider]  fluticasone (FLONASE) 50 MCG/ACT nasal spray PLACE 2 SPRAYS INTO BOTH NOSTRILS DAILY. 11/05/16   Lucille Passy, MD  methocarbamol (ROBAXIN) 750 MG tablet Take 1 tablet (750 mg total) by mouth 3 (three) times daily as needed for muscle spasms. 07/29/17   Elby Beck, FNP    Allergies    Other  Review of Systems   Review of Systems  Constitutional:  Positive for fatigue and fever. Negative for chills.  HENT:  Positive for sore throat. Negative for congestion, drooling, rhinorrhea, trouble swallowing and voice change.   Eyes:  Negative for visual disturbance.  Respiratory:  Positive for cough and  shortness of breath.   Cardiovascular:  Negative for chest pain and leg swelling.  Gastrointestinal:  Negative for abdominal pain, constipation, diarrhea, nausea and vomiting.  Genitourinary:  Negative for difficulty urinating, dysuria, hematuria, penile discharge, penile pain, scrotal swelling and testicular pain.  Musculoskeletal:  Negative for back pain, neck pain and neck stiffness.  Skin:  Negative for color change and rash.  Neurological:  Positive for headaches. Negative for dizziness, tremors, seizures, syncope, facial asymmetry, speech difficulty, weakness, light-headedness and numbness.  Psychiatric/Behavioral:  Negative for confusion.    Physical Exam Updated Vital Signs BP (!) 154/105 (BP Location: Right Arm)   Pulse 77   Temp 99.5 F (37.5 C) (Oral)   Resp 20   Ht 6' (1.829 m)   Wt 115.7 kg   SpO2 97%   BMI 34.58 kg/m   Physical Exam Vitals and nursing note reviewed.  Constitutional:      General: He is not in acute distress.    Appearance: He is not ill-appearing, toxic-appearing or diaphoretic.  HENT:     Head: Normocephalic. No raccoon eyes, abrasion, contusion, masses, right periorbital erythema or left periorbital erythema.     Jaw: No trismus or pain on movement.     Mouth/Throat:     Mouth: Mucous membranes are moist.     Pharynx: Oropharynx is clear. Uvula midline. No pharyngeal swelling, oropharyngeal exudate, posterior oropharyngeal erythema or uvula swelling.     Tonsils: No tonsillar abscesses. 1+ on the right. 1+ on the left.  Eyes:     General: No scleral icterus.       Right eye: No discharge.        Left eye: No discharge.     Extraocular Movements: Extraocular movements intact.     Pupils: Pupils are equal, round, and reactive to light.  Cardiovascular:     Rate and Rhythm: Normal rate.  Pulmonary:     Effort: Pulmonary effort is normal. No tachypnea, bradypnea or respiratory distress.     Breath sounds: Normal breath sounds. No stridor.      Comments: Patient able to speak in full complete sentences without difficulty Abdominal:     General: There is no distension. There are no signs of injury.     Palpations: Abdomen is soft. There is no mass or pulsatile mass.     Tenderness: There is no abdominal tenderness. There is no guarding or rebound.  Musculoskeletal:     Cervical back: Normal range of motion and neck supple. No rigidity.     Right lower leg: No swelling, deformity, lacerations, tenderness or bony tenderness. No edema.     Left lower leg: No swelling, deformity, lacerations, tenderness or bony tenderness. No edema.  Skin:    General: Skin is warm and dry.  Neurological:     General: No focal  deficit present.     Mental Status: He is alert and oriented to person, place, and time.     GCS: GCS eye subscore is 4. GCS verbal subscore is 5. GCS motor subscore is 6.     Cranial Nerves: No cranial nerve deficit or facial asymmetry.     Sensory: Sensation is intact.     Motor: No weakness, tremor, seizure activity or pronator drift.     Coordination: Romberg sign negative. Finger-Nose-Finger Test normal.     Gait: Gait is intact. Gait normal.     Comments: CN II-XII intact, equal grip strength, +5 strength to bilateral upper and lower extremities, sensation to light touch intact  Psychiatric:        Behavior: Behavior is cooperative.    ED Results / Procedures / Treatments   Labs (all labs ordered are listed, but only abnormal results are displayed) Labs Reviewed  RESP PANEL BY RT-PCR (FLU A&B, COVID) ARPGX2  CBC WITH DIFFERENTIAL/PLATELET  BASIC METABOLIC PANEL    EKG None  Radiology DG Chest Portable 1 View  Result Date: 01/01/2021 CLINICAL DATA:  Shortness of breath. EXAM: PORTABLE CHEST 1 VIEW COMPARISON:  None. FINDINGS: The heart size and mediastinal contours are within normal limits. Both lungs are clear. The visualized skeletal structures are unremarkable. IMPRESSION: No active disease. Electronically  Signed   By: Marijo Conception M.D.   On: 01/01/2021 12:47    Procedures Procedures   Medications Ordered in ED Medications - No data to display  ED Course  I have reviewed the triage vital signs and the nursing notes.  Pertinent labs & imaging results that were available during my care of the patient were reviewed by me and considered in my medical decision making (see chart for details).    MDM Rules/Calculators/A&P                          Alert 50 year old male no acute distress, nontoxic-appearing.  Patient presents emergency department with a chief complaint of fever, cough, fatigue, and intermittent headaches over the last week.   Patient reports exposure to tick 2 months prior.  Tick was on his person for less than 24 hours.  Patient denies any bull's-eye rash or diffuse rash after tick exposure.  Low suspicion for Lyme disease or [place] spotted fever at this time.  Low suspicion for PE as patient can be ruled out via [facility] criteria.  Low suspicion for subarachnoid hemorrhage at this time as patient's headache is gradual in onset and become progressively worse over time.  Patient has no focal neurological deficits on physical exam.  CBC and BMP are unremarkable Respiratory panel negative for COVID-19 and influenza. Chest x-ray shows no active cardiopulmonary disease.  Suspect patient's symptoms are due to viral upper respiratory infection.  We will give patient prescription for Tessalon.  Patient advised to follow-up with primary care provider if symptoms do not improve.  Patient given strict return precautions.  Patient expressed understanding of all instructions and is agreeable with this plan.  Final Clinical Impression(s) / ED Diagnoses Final diagnoses:  Viral illness    Rx / DC Orders ED Discharge Orders          Ordered    benzonatate (TESSALON) 100 MG capsule  Every 8 hours PRN        01/01/21 1440             Loni Beckwith, [place] 01/01/21  2345  Loni Beckwith, PA-C 01/01/21 2346    Gareth Morgan, MD 01/02/21 2212

## 2021-01-01 NOTE — ED Notes (Signed)
Discharge instructions discussed with pt. Pt verbalized understanding. Pt stable and ambulatory.  °

## 2021-01-01 NOTE — ED Triage Notes (Signed)
Pt c/o fever since Wednesday. States he has a non-productive cough and fatigue. Took ibuprofen at 0630.

## 2022-10-09 DIAGNOSIS — Z03818 Encounter for observation for suspected exposure to other biological agents ruled out: Secondary | ICD-10-CM | POA: Diagnosis not present

## 2022-10-09 DIAGNOSIS — R030 Elevated blood-pressure reading, without diagnosis of hypertension: Secondary | ICD-10-CM | POA: Diagnosis not present

## 2022-10-09 DIAGNOSIS — R5383 Other fatigue: Secondary | ICD-10-CM | POA: Diagnosis not present

## 2022-10-09 DIAGNOSIS — J029 Acute pharyngitis, unspecified: Secondary | ICD-10-CM | POA: Diagnosis not present

## 2022-10-13 ENCOUNTER — Ambulatory Visit (INDEPENDENT_AMBULATORY_CARE_PROVIDER_SITE_OTHER): Payer: BC Managed Care – PPO

## 2022-10-13 ENCOUNTER — Ambulatory Visit
Admission: EM | Admit: 2022-10-13 | Discharge: 2022-10-13 | Disposition: A | Discharge status: Home / Self Care | Payer: BC Managed Care – PPO | Attending: Emergency Medicine | Admitting: Emergency Medicine

## 2022-10-13 ENCOUNTER — Encounter: Payer: Self-pay | Admitting: Emergency Medicine

## 2022-10-13 DIAGNOSIS — I10 Essential (primary) hypertension: Secondary | ICD-10-CM | POA: Diagnosis not present

## 2022-10-13 DIAGNOSIS — R051 Acute cough: Secondary | ICD-10-CM | POA: Diagnosis not present

## 2022-10-13 DIAGNOSIS — J0100 Acute maxillary sinusitis, unspecified: Secondary | ICD-10-CM

## 2022-10-13 DIAGNOSIS — R059 Cough, unspecified: Secondary | ICD-10-CM | POA: Diagnosis not present

## 2022-10-13 DIAGNOSIS — J209 Acute bronchitis, unspecified: Secondary | ICD-10-CM | POA: Diagnosis not present

## 2022-10-13 MED ORDER — AZITHROMYCIN 250 MG PO TABS: 250.0000 mg | ORAL_TABLET | Freq: Every day | ORAL | 0 refills | Status: DC

## 2022-10-13 MED ORDER — ALBUTEROL SULFATE HFA 108 (90 BASE) MCG/ACT IN AERS: 1.0000 | INHALATION_SPRAY | Freq: Four times a day (QID) | RESPIRATORY_TRACT | 0 refills | Status: DC | PRN

## 2022-10-13 MED ORDER — PREDNISONE 10 MG (21) PO TBPK: ORAL_TABLET | Freq: Every day | ORAL | 0 refills | Status: DC

## 2022-10-13 NOTE — ED Provider Notes (Signed)
Renaldo Fiddler    CSN: [id number] Arrival date & time: 10/13/22  1117      History   Chief Complaint Chief Complaint  Patient presents with   Cough    HPI Ray Herrera is a 52 y.o. male.  Patient presents with congestion and cough x 1 week.  He states that his symptoms are getting worse and he feels like he is developing pneumonia, which he had several years ago.  He denies fever, rash, shortness of breath, chest pain, or other symptoms.  Treatment at home with Mucinex HBP and Tylenol.  He was [facility] at [facility] on [facility]/2022; negative for COVID, flu, strep; treated symptomatically.  His medical history includes GERD, chronic headaches, hypertension, dyspnea on exertion.  The history is provided by the patient and medical records.    Past Medical History:  Diagnosis Date   Chronic headaches    GERD (gastroesophageal reflux disease)    Heart murmur    Hypertension     Patient Active Problem List   Diagnosis Date Noted   Acute pain of right knee 10/04/2018   Chest pain 02/15/2018   DOE (dyspnea on exertion) 02/15/2018   GERD (gastroesophageal reflux disease) 08/06/2011   Hx of decompressive lumbar laminectomy 08/06/2011   H/O repair of rotator cuff 08/06/2011    Past Surgical History:  Procedure Laterality Date   distoclavicalectomy  2007   right   MICRODISCECTOMY LUMBAR  2011   L5-S1       Home Medications    Prior to Admission medications   Medication Sig Start Date End Date Taking? Authorizing Provider  albuterol (VENTOLIN HFA) 108 (90 Base) MCG/ACT inhaler Inhale 1-2 puffs into the lungs every 6 (six) hours as needed. 10/13/22  Yes Mickie Bail, NP  azithromycin (ZITHROMAX) 250 MG tablet Take 1 tablet (250 mg total) by mouth daily. Take first 2 tablets together, then 1 every day until finished. 10/13/22  Yes Mickie Bail, NP  fluticasone (FLONASE) 50 MCG/ACT nasal spray PLACE 2 SPRAYS INTO BOTH NOSTRILS DAILY. 11/05/16  Yes Dianne Dun, MD   predniSONE (STERAPRED UNI-PAK 21 TAB) 10 MG (21) TBPK tablet Take by mouth daily. As directed 10/13/22  Yes Mickie Bail, NP  benzonatate (TESSALON) 100 MG capsule Take 1 capsule (100 mg total) by mouth every 8 (eight) hours as needed for cough. 01/01/21   Haskel Schroeder, PA-C  Diclofenac Sodium (PENNSAID) 2 % SOLN Place 1 application onto the skin 2 (two) times daily. 10/05/18   Myra Rude, MD  Esomeprazole Magnesium (NEXIUM PO) Take by mouth as needed.    [provider]  methocarbamol (ROBAXIN) 750 MG tablet Take 1 tablet (750 mg total) by mouth 3 (three) times daily as needed for muscle spasms. 07/29/17   Emi Belfast, FNP    Family History Family History  Problem Relation Age of Onset   Diabetes Father    Heart disease Father    Hypertension Father    Irritable bowel syndrome Father    Diabetes Paternal Grandfather    Heart disease Paternal Grandfather    Lung cancer Maternal Grandfather    Cancer Other        abdominal, maternal great aunt   Crohn's disease Brother    Colon cancer Neg Hx    Kidney disease Neg Hx    Prostate cancer Neg Hx     Social History Social History   Tobacco Use   Smoking status: Never  Smokeless tobacco: Never  Substance Use Topics   Alcohol use: Yes    Alcohol/week: 0.0 standard drinks of alcohol    Comment: 2 shots weekly   Drug use: No     Allergies   Other   Review of Systems Review of Systems  Constitutional:  Negative for chills and fever.  HENT:  Positive for congestion. Negative for ear pain and sore throat.   Respiratory:  Positive for cough. Negative for shortness of breath.   Cardiovascular:  Negative for chest pain and palpitations.  Gastrointestinal:  Negative for diarrhea and vomiting.  Skin:  Negative for color change and rash.  All other systems reviewed and are negative.    Physical Exam Triage Vital Signs ED Triage Vitals  Enc Vitals Group     BP 10/13/22 1126 (!) 166/93     Pulse  Rate 10/13/22 1126 70     Resp 10/13/22 1126 18     Temp 10/13/22 1126 97.6 F (36.4 C)     Temp Source 10/13/22 1126 Oral     SpO2 10/13/22 1126 96 %     Weight --      Height --      Head Circumference --      Peak Flow --      Pain Score 10/13/22 1128 6     Pain Loc --      Pain Edu? --      Excl. in GC? --    No data found.  Updated Vital Signs BP (!) 166/93 (BP Location: Left Arm)   Pulse 70   Temp 97.6 F (36.4 C) (Oral)   Resp 18   SpO2 96%   Visual Acuity Right Eye Distance:   Left Eye Distance:   Bilateral Distance:    Right Eye Near:   Left Eye Near:    Bilateral Near:     Physical Exam Vitals and nursing note reviewed.  Constitutional:      General: He is not in acute distress.    Appearance: Normal appearance. He is well-developed. He is not ill-appearing.  HENT:     Right Ear: Tympanic membrane normal.     Left Ear: Tympanic membrane normal.     Nose: Congestion present.     Mouth/Throat:     Mouth: Mucous membranes are moist.     Pharynx: Oropharynx is clear.  Cardiovascular:     Rate and Rhythm: Normal rate and regular rhythm.     Heart sounds: No murmur heard. Pulmonary:     Effort: Pulmonary effort is normal. No respiratory distress.     Breath sounds: Rhonchi present.     Comments: Scattered rhonchi in upper airway.   Musculoskeletal:     Cervical back: Neck supple.  Skin:    General: Skin is warm and dry.  Neurological:     Mental Status: He is alert.  Psychiatric:        Mood and Affect: Mood normal.        Behavior: Behavior normal.      UC Treatments / Results  Labs (all labs ordered are listed, but only abnormal results are displayed) Labs Reviewed - No data to display  EKG   Radiology DG Chest 2 View  Result Date: 10/13/2022 CLINICAL DATA:  Chest congestion. Cough. 4 days ago flu, strep, COVID negative. EXAM: CHEST - 2 VIEW COMPARISON:  Chest radiograph 01/01/2021 and 02/02/2018 FINDINGS: Cardiac silhouette and  mediastinal contours are within normal limits. The lungs are clear.  No pleural effusion or pneumothorax. Mild-to-moderate multilevel degenerative disc changes of the thoracic spine. IMPRESSION: No active cardiopulmonary disease. Electronically Signed   By: Neita Garnet M.D.   On: 10/13/2022 12:18    Procedures Procedures (including critical care time)  Medications Ordered in UC Medications - No data to display  Initial Impression / Assessment and Plan / UC Course  I have reviewed the triage vital signs and the nursing notes.  Pertinent labs & imaging results that were available during my care of the patient were reviewed by me and considered in my medical decision making (see chart for details).    Acute bronchitis, cough, acute sinusitis, Elevated blood pressure with HTN.   Chest x-ray negative.  Treating with albuterol inhaler, prednisone, Zithromax.  Education provided on bronchitis and sinus infection.  Also discussed with patient that his blood pressure is elevated today and needs to be rechecked in 2 to 4 weeks.  Education provided on managing hypertension.  Patient does not currently have a PCP and does not want assistance with scheduling an appointment to establish a PCP today.  He states he will go onto the website with his wife and schedule an appointment.  He agrees to plan of care.  Final Clinical Impressions(s) / UC Diagnoses   Final diagnoses:  Acute cough  Acute bronchitis, unspecified organism  Acute non-recurrent maxillary sinusitis  Elevated blood pressure reading in office with diagnosis of hypertension     Discharge Instructions      Take the Zithromax and prednisone as directed.  Use the albuterol inhaler as directed.    Establish a primary care provider.    Your blood pressure is elevated today at 166/93.  Please have this rechecked in 2-4 weeks.          ED Prescriptions     Medication Sig Dispense Auth. Provider   azithromycin (ZITHROMAX) 250 MG  tablet Take 1 tablet (250 mg total) by mouth daily. Take first 2 tablets together, then 1 every day until finished. 6 tablet Mickie Bail, NP   predniSONE (STERAPRED UNI-PAK 21 TAB) 10 MG (21) TBPK tablet Take by mouth daily. As directed 21 tablet Mickie Bail, NP   albuterol (VENTOLIN HFA) 108 (90 Base) MCG/ACT inhaler Inhale 1-2 puffs into the lungs every 6 (six) hours as needed. 18 g Mickie Bail, NP      PDMP not reviewed this encounter.   Mickie Bail, NP 10/13/22 1233

## 2022-10-13 NOTE — ED Triage Notes (Signed)
Pt reports a cough since Tuesday and feels like there may be fluid on his lungs. Reports going [facility] Thursday. Flu, Strep and Covid were neg and feels like he's not feeling any better. States feels like he has pneumonia.

## 2022-10-13 NOTE — Discharge Instructions (Addendum)
Take the Zithromax and prednisone as directed.  Use the albuterol inhaler as directed.    Establish a primary care provider.    Your blood pressure is elevated today at 166/93.  Please have this rechecked in 2-4 weeks.

## 2023-12-22 ENCOUNTER — Ambulatory Visit: Admitting: Family

## 2024-02-02 ENCOUNTER — Ambulatory Visit (INDEPENDENT_AMBULATORY_CARE_PROVIDER_SITE_OTHER): Admitting: Family

## 2024-02-02 ENCOUNTER — Encounter: Payer: Self-pay | Admitting: Family

## 2024-02-02 VITALS — BP 158/105 | HR 77 | Temp 99.0°F | Ht 72.0 in | Wt 265.0 lb

## 2024-02-02 DIAGNOSIS — K219 Gastro-esophageal reflux disease without esophagitis: Secondary | ICD-10-CM | POA: Diagnosis not present

## 2024-02-02 DIAGNOSIS — D582 Other hemoglobinopathies: Secondary | ICD-10-CM | POA: Diagnosis not present

## 2024-02-02 DIAGNOSIS — E7800 Pure hypercholesterolemia, unspecified: Secondary | ICD-10-CM

## 2024-02-02 DIAGNOSIS — R030 Elevated blood-pressure reading, without diagnosis of hypertension: Secondary | ICD-10-CM | POA: Diagnosis not present

## 2024-02-02 NOTE — Progress Notes (Signed)
 New Patient Office Visit  Subjective:  Patient ID: Ray Herrera, male    DOB: 1970/10/27  Age: 53 y.o. MRN: [id number]  CC:  Chief Complaint  Patient presents with   Establish Care    HPI Ray Herrera is here to establish care as a new patient.  Oriented to practice routines and expectations.  Prior provider was: [name], MD  Pt is without acute concerns.   Abdominal issues: does have crohns in the family, he will have bouts of stomach issues for 1-2 days. Worse with drinking alcohol. When these occur he's either constipated for 2-3 days and or diarrhea that is hard to control. Has seen GI in the past for this, has been going on and off for years. No mucous and or blood in the stool   chronic concerns:  Elevated BP without HTN: has been feeling it's elevated more often than not especially after riding. Headaches have become more frequent and can 'feel my pulse sometimes' and his ears will ring. No blurry vision. Hasn't really checked his blood pressure at home. Does have a cuff at home. Drinks one bang daily     ROS: Negative unless specifically indicated above in HPI.   Current Outpatient Medications:    fluticasone  (FLONASE ) 50 MCG/ACT nasal spray, PLACE 2 SPRAYS INTO BOTH NOSTRILS DAILY., Disp: 16 g, Rfl: 2 Past Medical History:  Diagnosis Date   Chronic headaches    GERD (gastroesophageal reflux disease)    Heart murmur    Hypertension    Past Surgical History:  Procedure Laterality Date   distoclavicalectomy  2007   right   MICRODISCECTOMY LUMBAR  2011   L5-S1    Objective:   Today's Vitals: BP (!) 158/105   Pulse 77   Temp 99 F (37.2 C) (Temporal)   Ht 6' (1.829 m)   Wt 265 lb (120.2 kg)   SpO2 98%   BMI 35.94 kg/m   Physical Exam Vitals reviewed.  Constitutional:      General: He is not in acute distress.    Appearance: Normal appearance. He is normal weight. He is not ill-appearing, toxic-appearing or diaphoretic.  Cardiovascular:      Rate and Rhythm: Normal rate.  Pulmonary:     Effort: Pulmonary effort is normal.  Musculoskeletal:        General: Normal range of motion.  Neurological:     General: No focal deficit present.     Mental Status: He is alert and oriented to person, place, and time. Mental status is at baseline.  Psychiatric:        Mood and Affect: Mood normal.        Behavior: Behavior normal.        Thought Content: Thought content normal.        Judgment: Judgment normal.     Assessment & Plan:  Gastroesophageal reflux disease without esophagitis Assessment & Plan: Stable at current    Elevated blood pressure reading in office without diagnosis of hypertension Assessment & Plan: Pt advised of the following:  Continue medication as prescribed. Monitor blood pressure periodically and/or when you feel symptomatic. Goal is <130/90 on average. Ensure that you have rested for 30 minutes prior to checking your blood pressure. Record your readings and bring them to your next visit if necessary.work on a low sodium diet.   Orders: -     Comprehensive metabolic panel with GFR -     TSH  Elevated hemoglobin (HCC) -  CBC with Differential/Platelet  Elevated LDL cholesterol level Assessment & Plan: Ordered lipid panel, pending results. Work on low cholesterol diet and exercise as tolerated   Orders: -     Lipid panel    Follow-up: Return in about 1 month (around 03/04/2024) for f/u blood pressure.   [name], FNP

## 2024-02-02 NOTE — Assessment & Plan Note (Signed)
Stable at current  

## 2024-02-02 NOTE — Assessment & Plan Note (Signed)
 Pt advised of the following:  Continue medication as prescribed. Monitor blood pressure periodically and/or when you feel symptomatic. Goal is <130/90 on average. Ensure that you have rested for 30 minutes prior to checking your blood pressure. Record your readings and bring them to your next visit if necessary.work on a low sodium diet.

## 2024-02-02 NOTE — Assessment & Plan Note (Signed)
 Ordered lipid panel, pending results. Work on low cholesterol diet and exercise as tolerated

## 2024-02-03 LAB — COMPREHENSIVE METABOLIC PANEL WITH GFR
ALT: 25 U/L (ref 0–53)
AST: 19 U/L (ref 0–37)
Albumin: 4.9 g/dL (ref 3.5–5.2)
Alkaline Phosphatase: 67 U/L (ref 39–117)
BUN: 18 mg/dL (ref 6–23)
CO2: 29 meq/L (ref 19–32)
Calcium: 10.3 mg/dL (ref 8.4–10.5)
Chloride: 103 meq/L (ref 96–112)
Creatinine, Ser: 1.17 mg/dL (ref 0.40–1.50)
GFR: 71.82 mL/min (ref >60.00)
Glucose, Bld: 96 mg/dL (ref 70–99)
Potassium: 4.5 meq/L (ref 3.5–5.1)
Sodium: 142 meq/L (ref 135–145)
Total Bilirubin: 0.8 mg/dL (ref 0.2–1.2)
Total Protein: 7.7 g/dL (ref 6.0–8.3)

## 2024-02-03 LAB — LIPID PANEL
Cholesterol: 188 mg/dL (ref 0–200)
HDL: 61.2 mg/dL (ref >39.00)
LDL Cholesterol: 92 mg/dL (ref 0–99)
NonHDL: 126.62
Total CHOL/HDL Ratio: 3
Triglycerides: 174 mg/dL — ABNORMAL HIGH (ref 0.0–149.0)
VLDL: 34.8 mg/dL (ref 0.0–40.0)

## 2024-02-03 LAB — CBC WITH DIFFERENTIAL/PLATELET
Basophils Absolute: 0 K/uL (ref 0.0–0.1)
Basophils Relative: 0.5 % (ref 0.0–3.0)
Eosinophils Absolute: 0 K/uL (ref 0.0–0.7)
Eosinophils Relative: 0.6 % (ref 0.0–5.0)
HCT: 45.9 % (ref 39.0–52.0)
Hemoglobin: 14.8 g/dL (ref 13.0–17.0)
Lymphocytes Relative: 27.1 % (ref 12.0–46.0)
Lymphs Abs: 2.2 K/uL (ref 0.7–4.0)
MCHC: 32.3 g/dL (ref 30.0–36.0)
MCV: 82.7 fl (ref 78.0–100.0)
Monocytes Absolute: 0.7 K/uL (ref 0.1–1.0)
Monocytes Relative: 8.6 % (ref 3.0–12.0)
Neutro Abs: 5.1 K/uL (ref 1.4–7.7)
Neutrophils Relative %: 63.2 % (ref 43.0–77.0)
Platelets: 225 K/uL (ref 150.0–400.0)
RBC: 5.55 Mil/uL (ref 4.22–5.81)
RDW: 14.4 % (ref 11.5–15.5)
WBC: 8.1 K/uL (ref 4.0–10.5)

## 2024-02-03 LAB — TSH: TSH: 1.12 u[IU]/mL (ref 0.35–5.50)

## 2024-02-04 ENCOUNTER — Ambulatory Visit: Payer: Self-pay | Admitting: Family

## 2024-03-07 ENCOUNTER — Ambulatory Visit (INDEPENDENT_AMBULATORY_CARE_PROVIDER_SITE_OTHER): Admitting: Family

## 2024-03-07 ENCOUNTER — Encounter: Payer: Self-pay | Admitting: Family

## 2024-03-07 VITALS — BP 150/110 | HR 66 | Temp 98.9°F | Ht 72.0 in | Wt 265.4 lb

## 2024-03-07 DIAGNOSIS — I10 Essential (primary) hypertension: Secondary | ICD-10-CM

## 2024-03-07 DIAGNOSIS — R030 Elevated blood-pressure reading, without diagnosis of hypertension: Secondary | ICD-10-CM

## 2024-03-07 MED ORDER — LOSARTAN POTASSIUM 50 MG PO TABS: 50.0000 mg | ORAL_TABLET | Freq: Every day | ORAL | 1 refills | Status: DC

## 2024-03-07 NOTE — Progress Notes (Signed)
 "                                                         Established Patient Office Visit  Subjective:      CC:  Chief Complaint  Patient presents with   Hypertension    HPI: Ray Herrera is a 53 y.o. male presenting on 03/07/2024 for Hypertension .  Discussed the use of AI scribe software for clinical note transcription with the patient, who gave verbal consent to proceed.  History of Present Illness Ray Herrera is a 53 year old male with hypertension who presents with elevated blood pressure readings.  He has consistently elevated blood pressure readings, with systolic values ranging from 135 to 150 mmHg and diastolic values between 75 to 85 mmHg. He experiences frequent headaches, a sensation of feeling his pulse, and occasional tinnitus. These symptoms have persisted since the last visit.  He has a history of a heart murmur but does not recall the details of the diagnosis. No chest pain, significant swelling in his feet, or heart palpitations. He does a lot of walking.          Social history:  Relevant past medical, surgical, family and social history reviewed and updated as indicated. Interim medical history since our last visit reviewed.  DATA REVIEWED: CHART IN EPIC     ROS: Negative unless specifically indicated above in HPI.    Current Outpatient Medications:    fluticasone  (FLONASE ) 50 MCG/ACT nasal spray, PLACE 2 SPRAYS INTO BOTH NOSTRILS DAILY., Disp: 16 g, Rfl: 2   losartan  (COZAAR ) 50 MG tablet, Take 1 tablet (50 mg total) by mouth daily., Disp: 90 tablet, Rfl: 1        Objective:        BP (!) 150/110 (BP Location: Left Arm, Patient Position: Sitting, Cuff Size: Large)   Pulse 66   Temp 98.9 F (37.2 C) (Temporal)   Ht 6' (1.829 m)   Wt 265 lb 6.4 oz (120.4 kg)   SpO2 99%   BMI 35.99 kg/m   Physical Exam VITALS: BP- 158/110 CARDIOVASCULAR: Blood pressure is elevated at 158/110 mmHg. No audible murmur.  Wt Readings from  Last 3 Encounters:  03/07/24 265 lb 6.4 oz (120.4 kg)  02/02/24 265 lb (120.2 kg)  01/01/21 255 lb (115.7 kg)    Physical Exam Constitutional:      General: He is not in acute distress.    Appearance: Normal appearance. He is normal weight. He is not ill-appearing, toxic-appearing or diaphoretic.  Cardiovascular:     Rate and Rhythm: Normal rate and regular rhythm.  Pulmonary:     Effort: Pulmonary effort is normal.  Musculoskeletal:        General: Normal range of motion.     Right lower leg: No edema.     Left lower leg: No edema.  Neurological:     General: No focal deficit present.     Mental Status: He is alert and oriented to person, place, and time. Mental status is at baseline.  Psychiatric:        Mood and Affect: Mood normal.        Behavior: Behavior normal.        Thought Content: Thought content normal.        Judgment: Judgment  normal.          Results   Assessment & Plan:   Assessment and Plan Assessment & Plan Hypertension Hypertension with systolic readings between 135-150 mmHg and diastolic readings between 75-85 mmHg. Recent office reading was 158/110 mmHg. Experiences chronic headaches, pulsatile tinnitus, and occasional ear ringing, potentially related to elevated blood pressure. No chest pain, significant heart palpitations, or audible heart murmur. Blood pressure control is necessary to reduce the risk of heart disease, stroke, and kidney disease. - Initiate losartan  50 mg once daily for blood pressure management and kidney protection. - Advise blood pressure monitoring once or twice daily, ensuring to sit for 5-10 minutes before measurement and to measure at least one hour after taking medication. - Follow up via MyChart in one week to assess blood pressure response and medication tolerance. - Consider increasing losartan  to 100 mg if blood pressure remains uncontrolled. - Request urine microalbumin test at next visit to assess kidney function  related to hypertension. - Discuss potential for kidney protection with losartan . - Encourage consistent daily medication timing, preferably in the morning.  Recording duration: 6 minutes      Return in about 3 months (around 06/06/2024).     [name], MSN, APRN, FNP-C [facility]     "

## 2024-03-18 MED ORDER — LOSARTAN POTASSIUM 100 MG PO TABS: 100.0000 mg | ORAL_TABLET | Freq: Every day | ORAL | 3 refills | Status: DC

## 2024-06-06 ENCOUNTER — Ambulatory Visit: Admitting: Family

## 2024-07-07 ENCOUNTER — Other Ambulatory Visit: Payer: Self-pay | Admitting: *Deleted

## 2024-07-07 ENCOUNTER — Ambulatory Visit: Admitting: Family

## 2024-07-07 DIAGNOSIS — R030 Elevated blood-pressure reading, without diagnosis of hypertension: Secondary | ICD-10-CM

## 2024-07-07 MED ORDER — LOSARTAN POTASSIUM 100 MG PO TABS: 100.0000 mg | ORAL_TABLET | Freq: Every day | ORAL | 1 refills | Status: AC
